# Patient Record
Sex: Male | Born: 1940 | Race: White | Hispanic: No | Marital: Married | Smoking: Former smoker
Health system: Southern US, Community
[De-identification: ages and names within clinical notes are randomized; demographics above are authoritative.]

## PROBLEM LIST (undated history)

## (undated) DIAGNOSIS — E785 Hyperlipidemia, unspecified: Secondary | ICD-10-CM

## (undated) HISTORY — PX: COLONOSCOPY: SHX174

## (undated) HISTORY — PX: HERNIA REPAIR: SHX51

## (undated) HISTORY — DX: Hyperlipidemia, unspecified: E78.5

## (undated) HISTORY — PX: POLYPECTOMY: SHX149

---

## 1998-12-09 ENCOUNTER — Ambulatory Visit (HOSPITAL_BASED_OUTPATIENT_CLINIC_OR_DEPARTMENT_OTHER): Admission: RE | Admit: 1998-12-09 | Discharge: 1998-12-09 | Payer: Self-pay | Admitting: Plastic Surgery

## 2011-04-23 ENCOUNTER — Encounter (INDEPENDENT_AMBULATORY_CARE_PROVIDER_SITE_OTHER): Payer: Self-pay | Admitting: Surgery

## 2012-11-02 DEATH — deceased

## 2014-07-17 DIAGNOSIS — R972 Elevated prostate specific antigen [PSA]: Secondary | ICD-10-CM | POA: Insufficient documentation

## 2014-07-17 DIAGNOSIS — E785 Hyperlipidemia, unspecified: Secondary | ICD-10-CM | POA: Insufficient documentation

## 2015-05-09 ENCOUNTER — Encounter: Payer: Self-pay | Admitting: Cardiology

## 2015-05-09 ENCOUNTER — Ambulatory Visit (INDEPENDENT_AMBULATORY_CARE_PROVIDER_SITE_OTHER): Payer: Medicare Other | Admitting: Cardiology

## 2015-05-09 VITALS — BP 134/82 | HR 61 | Ht 67.5 in | Wt 161.0 lb

## 2015-05-09 DIAGNOSIS — Z720 Tobacco use: Secondary | ICD-10-CM | POA: Diagnosis not present

## 2015-05-09 DIAGNOSIS — I493 Ventricular premature depolarization: Secondary | ICD-10-CM | POA: Diagnosis not present

## 2015-05-09 DIAGNOSIS — R002 Palpitations: Secondary | ICD-10-CM

## 2015-05-09 DIAGNOSIS — Z8249 Family history of ischemic heart disease and other diseases of the circulatory system: Secondary | ICD-10-CM | POA: Diagnosis not present

## 2015-05-09 DIAGNOSIS — IMO0001 Reserved for inherently not codable concepts without codable children: Secondary | ICD-10-CM

## 2015-05-09 DIAGNOSIS — Z87891 Personal history of nicotine dependence: Secondary | ICD-10-CM

## 2015-05-09 NOTE — Patient Instructions (Addendum)
Medication Instructions:  None  Labwork: None  Testing/Procedures: Triple AAA and 48 hour holter monitor in the same day  Follow-Up: Your physician wants you to follow-up in: 1 year  You will receive a reminder letter in the mail two months in advance. If you don't receive a letter, please call our office to schedule the follow-up appointment.   Any Other Special Instructions Will Be Listed Below (If Applicable). Pt will be called with appointments

## 2015-05-10 ENCOUNTER — Telehealth: Payer: Self-pay | Admitting: *Deleted

## 2015-05-10 NOTE — Telephone Encounter (Signed)
Dr. Aundra Dubin requested records from Dr. Vivianne Spence @ [phone]. Being sent over today with labs

## 2015-05-12 DIAGNOSIS — IMO0001 Reserved for inherently not codable concepts without codable children: Secondary | ICD-10-CM | POA: Insufficient documentation

## 2015-05-12 DIAGNOSIS — R002 Palpitations: Secondary | ICD-10-CM | POA: Insufficient documentation

## 2015-05-12 NOTE — Progress Notes (Signed)
Patient ID: Lee Pruitt, male   DOB: 26-May-1941, 74 y.o.   MRN: [id number] PCP: Vivianne Spence [facility])  74 yo with history of hyperlipidemia and PVCs presents for cardiology evaluation.  Patient was told in the past that he has PVCs.  He had a stress test done apparently because of this in the early 2000s that was negative per his report.  He has very good exercise tolerance.  He walks 4 miles at a time and does heavy outdoors work without exertional dyspnea or chest pain/tightness.    About 6 weeks ago, he was noticing a rapid heart rate and palpitations when lying in bed a night.  He did not have palpitations when he was up and active.  He also felt an "electrical shock" sensation down his arms as well as tremors.  He is not longer having the tachypalpitations but still notes tremors and a shock-like sensation in his hands and arms.  No upper extremity weakness or numbness.  No lightheadedness or syncope.  Recently had lab work done by PCP, sounds like all was normal (including TSH).  He has cut back on caffeine as well as on ETOH intake and thinks this has helped the palpitations.   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 Nuclear stress test in early 2000s: Normal per patient's report.   SH: Lives in [place] in summer, [place] in winter.  Married, retired Char[employer] fundraiser, 2-3 ETOH drinks/night, quit smoking > 30 years ago  FH: Father with AAA and CABG at 5  ROS: All systems negative except as per HPI.   Current Outpatient Prescriptions  Medication Sig Dispense Refill  . aspirin 81 MG chewable tablet Chew 81 mg by mouth daily.      Marland Kitchen atorvastatin (LIPITOR) 20 MG tablet Take 20 mg by mouth daily.    . DiphenhydrAMINE HCl, Sleep, (SLEEP AID, DIPHENHYDRAMINE, PO) Take by mouth. ZZZ NYQUIL BRAND    . Naproxen Sodium (ALEVE PO) Take by mouth daily. TAKES ONE TABLET     No current facility-administered  medications for this visit.   BP 134/82 mmHg  Pulse 61  Ht 5' 7.5" (1.715 m)  Wt 161 lb (73.029 kg)  BMI 24.83 kg/m2 General: NAD Neck: No JVD, no thyromegaly or thyroid nodule.  Lungs: Clear to auscultation bilaterally with normal respiratory effort. CV: Nondisplaced PMI.  Heart regular S1/S2, no S3/S4, no murmur.  No peripheral edema.  No carotid bruit.  Normal pedal pulses.  Abdomen: Soft, nontender, no hepatosplenomegaly, no distention.  Skin: Intact without lesions or rashes.  Neurologic: Alert and oriented x 3.  Psych: Normal affect. Extremities: No clubbing or cyanosis.  HEENT: Normal.   Assessment/Plan: 1. Palpitations: He was noticing these at night when lying in bed.  Not noticing during the day.   He has a history of PVCs.  I suspect that the palpitations are PVCs.  They have actually mostly resolved at this time.  No chest pain or dyspnea.  No recent increase in stress or change in medications.   - I will have him wear a 48 hour holter.  If he has frequent PVCs, he will likely need an echo. 2. AAA risk: Father had AAA, and patient is a prior smoker age 55-75 so should have AAA screening. I will arrange for abdominal US.  3. "Electrical shock" sensation/tremors: I am not sure what this is coming from.  It may not be cardiac, ?related to  c-spine arthritis.  If it continues, may need to see a neurologist.  4. Hyperlipidemia: Lipids done recently by PCP, I will ask for a copy.   Loralie Champagne 05/12/2015

## 2015-05-17 ENCOUNTER — Ambulatory Visit (HOSPITAL_COMMUNITY): Payer: Medicare Other

## 2015-05-24 ENCOUNTER — Other Ambulatory Visit: Payer: Self-pay | Admitting: Cardiology

## 2015-05-24 ENCOUNTER — Ambulatory Visit (HOSPITAL_COMMUNITY)
Admission: RE | Admit: 2015-05-24 | Discharge: 2015-05-24 | Disposition: A | Discharge status: Home / Self Care | Payer: Medicare Other | Source: Ambulatory Visit | Attending: Cardiology | Admitting: Cardiology

## 2015-05-24 ENCOUNTER — Ambulatory Visit (INDEPENDENT_AMBULATORY_CARE_PROVIDER_SITE_OTHER): Payer: Medicare Other

## 2015-05-24 ENCOUNTER — Other Ambulatory Visit: Payer: Self-pay

## 2015-05-24 ENCOUNTER — Ambulatory Visit (HOSPITAL_COMMUNITY): Admission: RE | Admit: 2015-05-24 | Payer: Medicare Other | Source: Ambulatory Visit

## 2015-05-24 ENCOUNTER — Telehealth: Payer: Self-pay | Admitting: *Deleted

## 2015-05-24 ENCOUNTER — Other Ambulatory Visit: Payer: Self-pay | Admitting: *Deleted

## 2015-05-24 ENCOUNTER — Other Ambulatory Visit (HOSPITAL_COMMUNITY): Payer: Medicare Other

## 2015-05-24 DIAGNOSIS — R002 Palpitations: Secondary | ICD-10-CM

## 2015-05-24 DIAGNOSIS — I493 Ventricular premature depolarization: Secondary | ICD-10-CM

## 2015-05-24 DIAGNOSIS — Z87891 Personal history of nicotine dependence: Secondary | ICD-10-CM

## 2015-05-24 DIAGNOSIS — Z720 Tobacco use: Secondary | ICD-10-CM

## 2015-05-24 DIAGNOSIS — Z8249 Family history of ischemic heart disease and other diseases of the circulatory system: Secondary | ICD-10-CM

## 2015-05-24 DIAGNOSIS — I714 Abdominal aortic aneurysm, without rupture: Secondary | ICD-10-CM | POA: Insufficient documentation

## 2015-05-24 DIAGNOSIS — Z8489 Family history of other specified conditions: Secondary | ICD-10-CM | POA: Diagnosis not present

## 2015-05-24 DIAGNOSIS — IMO0001 Reserved for inherently not codable concepts without codable children: Secondary | ICD-10-CM

## 2015-05-24 DIAGNOSIS — Z136 Encounter for screening for cardiovascular disorders: Secondary | ICD-10-CM

## 2015-05-24 NOTE — Telephone Encounter (Signed)
The hospital called stated put test in wrong try to correct was very confusion.  Change test with the Assistance of Bernardo Heater, RN worked on this test for 1 hour finally put test in correctly. Bernardo Heater, RN stated will keep eye on test to fall out of the loop.  Pt did have AAA duplex for medicare screening at hospital. Will route to [facility]

## 2015-05-24 NOTE — Telephone Encounter (Signed)
See note from Orpah Greek. Will continue to follow.

## 2015-05-27 ENCOUNTER — Telehealth: Payer: Self-pay | Admitting: Cardiology

## 2015-05-27 NOTE — Telephone Encounter (Signed)
Pt called to say he dropped off monitor today, pt given results of  abdominal ultrasound done 05/24/15.

## 2015-05-27 NOTE — Telephone Encounter (Signed)
New message     Pt returning call regarding monitor wore this weekend. Please call to discuss.

## 2015-11-06 DIAGNOSIS — E538 Deficiency of other specified B group vitamins: Secondary | ICD-10-CM | POA: Insufficient documentation

## 2017-06-06 IMAGING — US US AORTA
1 series · 14 of 23 positions shown · non-contrast
Comparison: No priors.

CLINICAL DATA: 73-year-old female with family history of abdominal
aortic aneurysm. History of smoking.

EXAM:
ULTRASOUND OF ABDOMINAL AORTA
TECHNIQUE: Ultrasound examination of the abdominal aorta was performed to
evaluate for abdominal aortic aneurysm.

[Series 1: us aorta · 0.22mm/px · 14 of 23 slices shown]
[im 1/23]
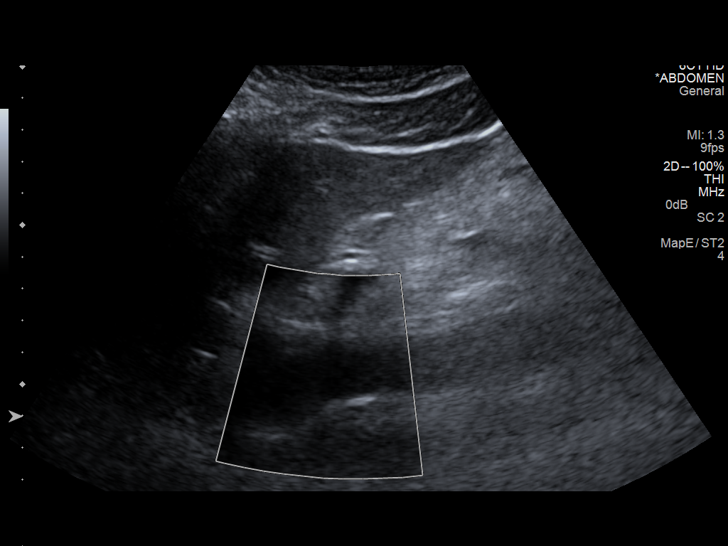
[im 3/23]
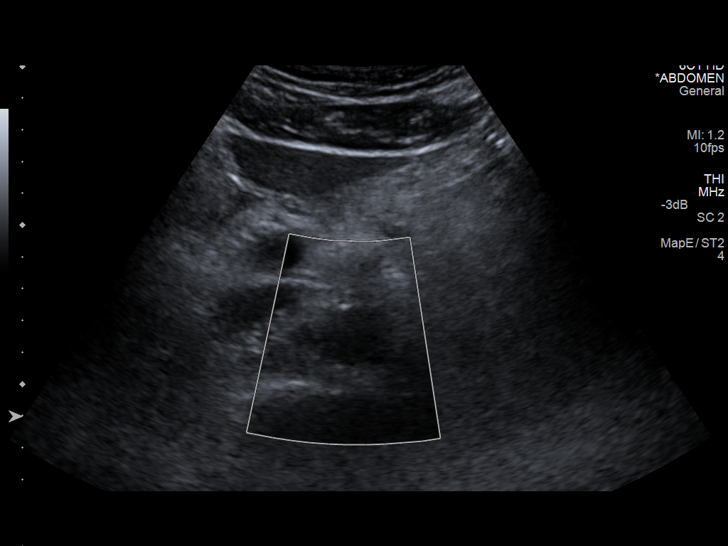
[im 5/23]
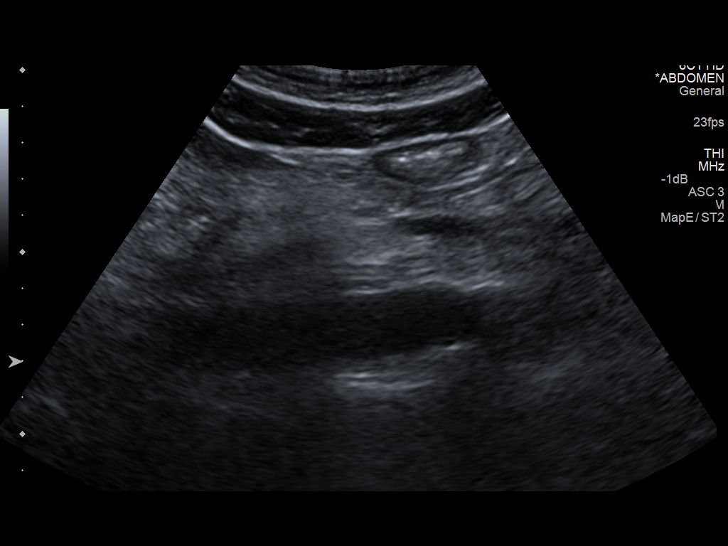
[im 6/23]
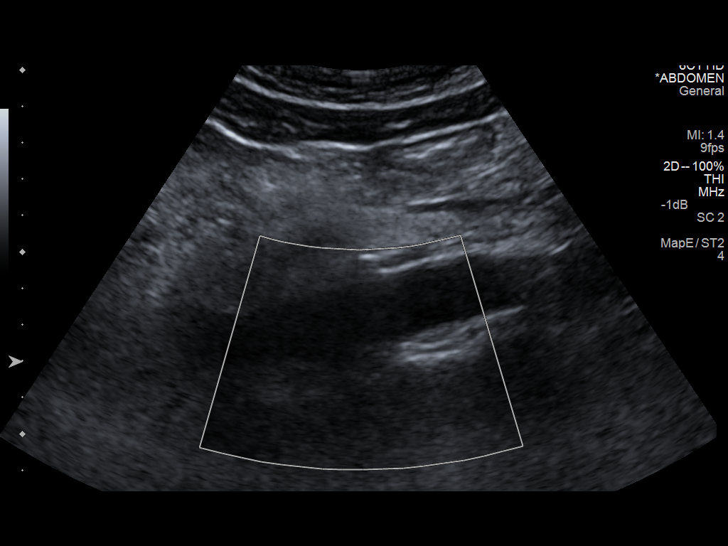
[im 8/23]
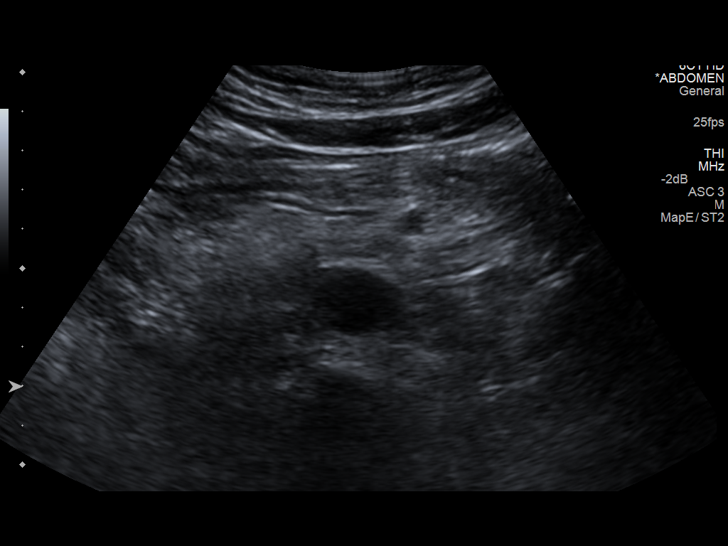
[im 10/23]
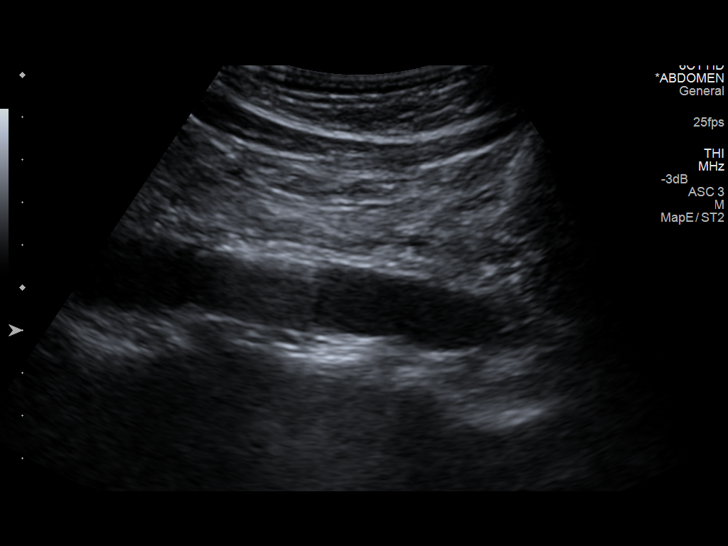
[im 11/23]
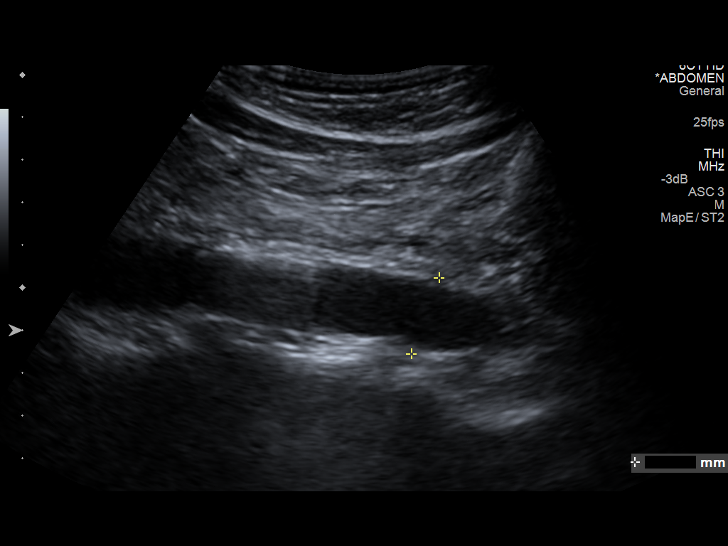
[im 13/23]
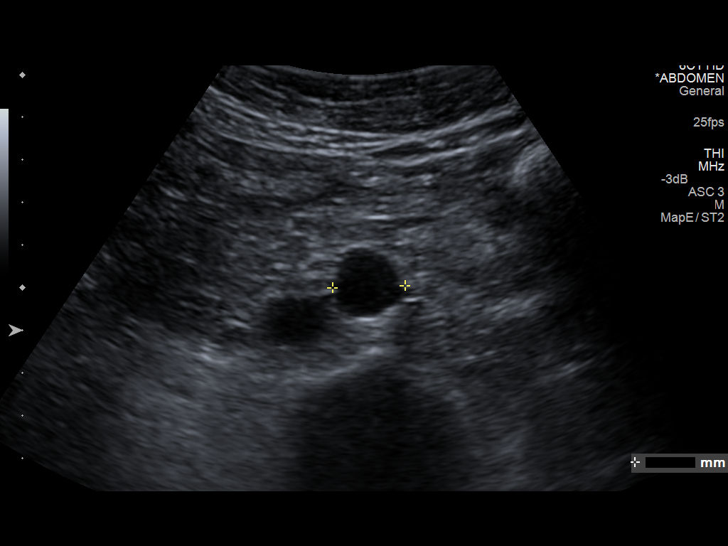
[im 14/23]
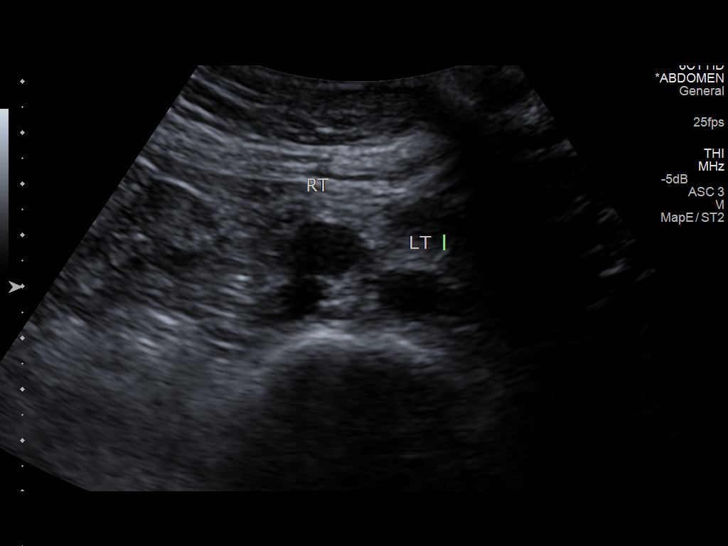
[im 16/23]
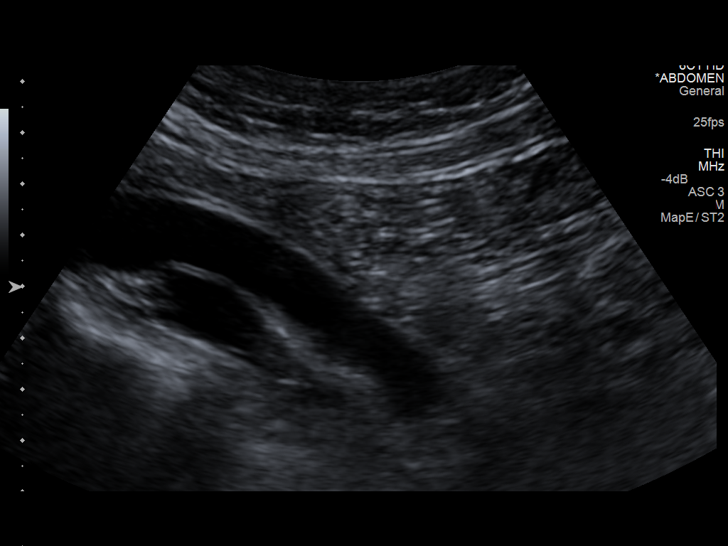
[im 18/23]
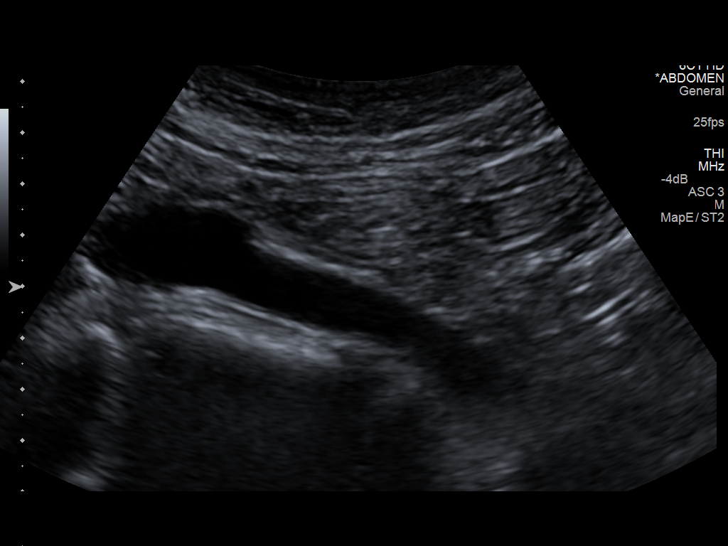
[im 19/23]
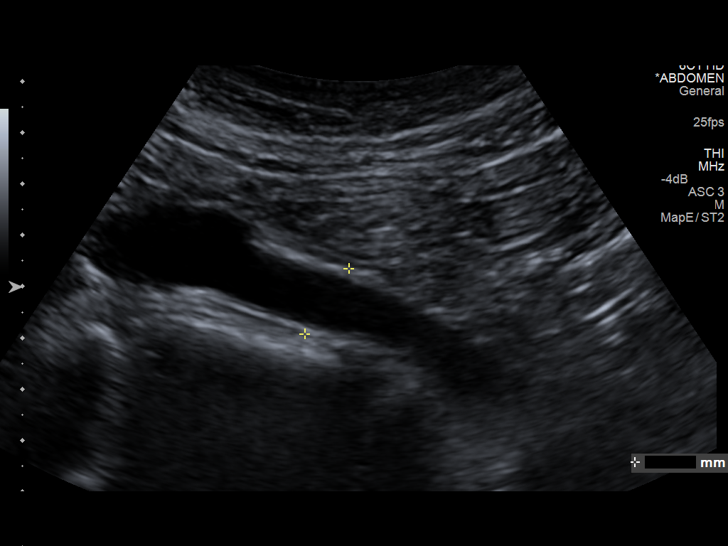
[im 21/23]
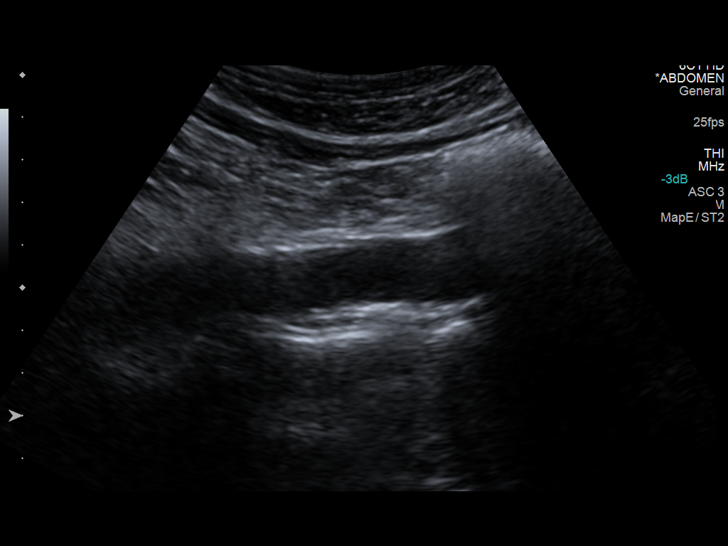
[im 23/23]
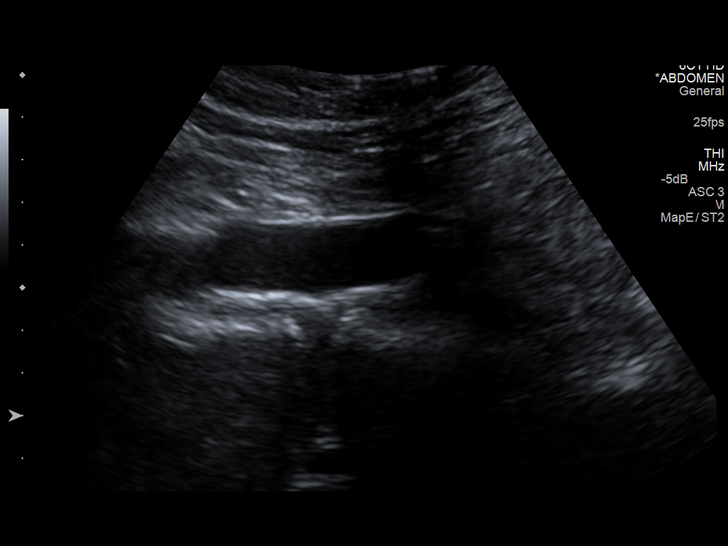

[14 of 23 positions shown; findings below may reference images not displayed]

FINDINGS: Abdominal Aorta

No aneurysm identified.

Maximum AP

Diameter:  2.3 cm proximally

Maximum TRV

Diameter: 2.3 cm proximally

Common Iliac Arteries

Right common iliac artery measures up to 1.4 x 1.5 cm. Left common
iliac artery measures up to 1.5 x 1.7 cm.
IMPRESSION: 1. No abdominal aortic aneurysm.
2. Ectasia of the common iliac arteries bilaterally (left greater
than right).

## 2017-10-13 ENCOUNTER — Telehealth: Payer: Self-pay | Admitting: Internal Medicine

## 2017-10-13 NOTE — Telephone Encounter (Signed)
Received colon/path reports. Patient says that he is due for another colonoscopy. He is requesting to see Dr. Henrene Pastor. Records placed on his desk for review.

## 2017-10-21 NOTE — Telephone Encounter (Signed)
Dr Henrene Pastor received, reviewed and accepted for  Lee Pruitt to transfer care to [facility].  I have called and left him a voicemail message to call back and schedule a Direct Colonoscopy with a Previsit appointment.

## 2017-10-27 NOTE — Telephone Encounter (Signed)
Spoke with patient who states he wants to schedule colon in April 2019. Patient will cb in February to set up appt. Records will be in records review file folder till then.

## 2017-12-03 ENCOUNTER — Encounter: Payer: Self-pay | Admitting: Internal Medicine

## 2018-02-07 ENCOUNTER — Other Ambulatory Visit: Payer: Self-pay

## 2018-02-07 ENCOUNTER — Ambulatory Visit (AMBULATORY_SURGERY_CENTER): Payer: Self-pay

## 2018-02-07 VITALS — Ht 67.0 in | Wt 167.0 lb

## 2018-02-07 DIAGNOSIS — Z1211 Encounter for screening for malignant neoplasm of colon: Secondary | ICD-10-CM

## 2018-02-07 NOTE — Progress Notes (Signed)
No egg or soy allergy known to patient  No issues with past sedation with any surgeries  or procedures, no intubation problems  No diet pills per patient No home 02 use per patient  No blood thinners per patient  Pt denies issues with constipation  No A fib or A flutter  EMMI video sent to pt's e mail , pt declined    

## 2018-02-22 ENCOUNTER — Encounter: Payer: Self-pay | Admitting: Internal Medicine

## 2018-02-22 ENCOUNTER — Ambulatory Visit (AMBULATORY_SURGERY_CENTER): Payer: Medicare Other | Admitting: Internal Medicine

## 2018-02-22 VITALS — BP 113/74 | HR 66 | Temp 96.2°F | Resp 15 | Ht 67.0 in | Wt 167.0 lb

## 2018-02-22 DIAGNOSIS — Z1211 Encounter for screening for malignant neoplasm of colon: Secondary | ICD-10-CM

## 2018-02-22 DIAGNOSIS — Z1212 Encounter for screening for malignant neoplasm of rectum: Secondary | ICD-10-CM

## 2018-02-22 MED ORDER — SODIUM CHLORIDE 0.9 % IV SOLN: 500.0000 mL | Freq: Once | INTRAVENOUS | Status: DC

## 2018-02-22 NOTE — Op Note (Signed)
[place] Patient Name: Lee Pruitt Procedure Date: 02/22/2018 11:11 AM MRN: [id number] Endoscopist: Docia Chuck. Henrene Pastor , MD Age: 77 Referring MD:  Date of Birth: December 09, 1940 Gender: Male Account #: [id number] Procedure:                Colonoscopy Indications:              Screening for colorectal malignant neoplasm. Has                            had several examinations previously which were                            negative. Last one elsewhere Medicines:                Monitored Anesthesia Care Procedure:                Pre-Anesthesia Assessment:                           - Prior to the procedure, a History and Physical                            was performed, and patient medications and                            allergies were reviewed. The patient's tolerance of                            previous anesthesia was also reviewed. The risks                            and benefits of the procedure and the sedation                            options and risks were discussed with the patient.                            All questions were answered, and informed consent                            was obtained. Prior Anticoagulants: The patient has                            taken no previous anticoagulant or antiplatelet                            agents. ASA Grade Assessment: II - A patient with                            mild systemic disease. After reviewing the risks                            and benefits, the patient was deemed in  satisfactory condition to undergo the procedure.                           After obtaining informed consent, the colonoscope                            was passed under direct vision. Throughout the                            procedure, the patient's blood pressure, pulse, and                            oxygen saturations were monitored continuously. The                            Colonoscope was introduced through  the anus and                            advanced to the the cecum, identified by                            appendiceal orifice and ileocecal valve. The                            ileocecal valve, appendiceal orifice, and rectum                            were photographed. The quality of the bowel                            preparation was excellent. The colonoscopy was                            performed without difficulty. The patient tolerated                            the procedure well. The bowel preparation used was                            SUPREP. Scope In: 11:23:01 AM Scope Out: 11:40:22 AM Scope Withdrawal Time: 0 hours 14 minutes 5 seconds  Total Procedure Duration: 0 hours 17 minutes 21 seconds  Findings:                 Multiple diverticula were found in the sigmoid                            colon.                           The exam was otherwise without abnormality on                            direct and retroflexion views. Complications:            No immediate complications. Estimated blood loss:  None. Estimated Blood Loss:     Estimated blood loss: none. Impression:               - Diverticulosis in the sigmoid colon.                           - The examination was otherwise normal on direct                            and retroflexion views.                           - No specimens collected. Recommendation:           - Repeat colonoscopy is not recommended for                            surveillance.                           - Patient has a contact number available for                            emergencies. The signs and symptoms of potential                            delayed complications were discussed with the                            patient. Return to normal activities tomorrow.                            Written discharge instructions were provided to the                            patient.                           -  Resume previous diet.                           - Continue present medications. Docia Chuck. Henrene Pastor, MD 02/22/2018 11:46:00 AM This report has been signed electronically.

## 2018-02-22 NOTE — Progress Notes (Signed)
Pt's states no medical or surgical changes since previsit or office visit. 

## 2018-02-22 NOTE — Progress Notes (Signed)
Spontaneous respirations throughout. VSS. Resting comfortably. To PACU on room air. Report to  RN. 

## 2018-02-22 NOTE — Patient Instructions (Signed)
**   Handout given on diverticulosis **   YOU HAD AN ENDOSCOPIC PROCEDURE TODAY AT THE [facility]:   Refer to the procedure report that was given to you for any specific questions about what was found during the examination.  If the procedure report does not answer your questions, please call your gastroenterologist to clarify.  If you requested that your care partner not be given the details of your procedure findings, then the procedure report has been included in a sealed envelope for you to review at your convenience later.  YOU SHOULD EXPECT: Some feelings of bloating in the abdomen. Passage of more gas than usual.  Walking can help get rid of the air that was put into your GI tract during the procedure and reduce the bloating. If you had a lower endoscopy (such as a colonoscopy or flexible sigmoidoscopy) you may notice spotting of blood in your stool or on the toilet paper. If you underwent a bowel prep for your procedure, you may not have a normal bowel movement for a few days.  Please Note:  You might notice some irritation and congestion in your nose or some drainage.  This is from the oxygen used during your procedure.  There is no need for concern and it should clear up in a day or so.  SYMPTOMS TO REPORT IMMEDIATELY:   Following lower endoscopy (colonoscopy or flexible sigmoidoscopy):  Excessive amounts of blood in the stool  Significant tenderness or worsening of abdominal pains  Swelling of the abdomen that is new, acute  Fever of 100F or higher  For urgent or emergent issues, a gastroenterologist can be reached at any hour by calling [phone].   DIET:  We do recommend a small meal at first, but then you may proceed to your regular diet.  Drink plenty of fluids but you should avoid alcoholic beverages for 24 hours.  ACTIVITY:  You should plan to take it easy for the rest of today and you should NOT DRIVE or use heavy machinery until tomorrow (because of the  sedation medicines used during the test).    FOLLOW UP: Our staff will call the number listed on your records the next business day following your procedure to check on you and address any questions or concerns that you may have regarding the information given to you following your procedure. If we do not reach you, we will leave a message.  However, if you are feeling well and you are not experiencing any problems, there is no need to return our call.  We will assume that you have returned to your regular daily activities without incident.  If any biopsies were taken you will be contacted by phone or by letter within the next 1-3 weeks.  Please call us at [phone] if you have not heard about the biopsies in 3 weeks.    SIGNATURES/CONFIDENTIALITY: You and/or your care partner have signed paperwork which will be entered into your electronic medical record.  These signatures attest to the fact that that the information above on your After Visit Summary has been reviewed and is understood.  Full responsibility of the confidentiality of this discharge information lies with you and/or your care-partner. 

## 2018-02-23 ENCOUNTER — Telehealth: Payer: Self-pay

## 2018-02-23 NOTE — Telephone Encounter (Signed)
  Follow up Call-  Call back number 02/22/2018  Post procedure Call Back phone  # [phone]-wife cell of to speak to her.  Permission to leave phone message Yes  Some recent data might be hidden     Patient questions:  Do you have a fever, pain , or abdominal swelling? No. Pain Score  0 *  Have you tolerated food without any problems? Yes.    Have you been able to return to your normal activities? Yes.    Do you have any questions about your discharge instructions: Diet   No. Medications  No. Follow up visit  No.  Do you have questions or concerns about your Care? No.  Actions: * If pain score is 4 or above: No action needed, pain <4.

## 2020-10-10 ENCOUNTER — Other Ambulatory Visit: Payer: Self-pay | Admitting: Urology

## 2020-10-10 DIAGNOSIS — R972 Elevated prostate specific antigen [PSA]: Secondary | ICD-10-CM

## 2021-04-23 ENCOUNTER — Encounter: Payer: Self-pay | Admitting: Internal Medicine

## 2021-04-23 ENCOUNTER — Ambulatory Visit (INDEPENDENT_AMBULATORY_CARE_PROVIDER_SITE_OTHER): Payer: Medicare Other | Admitting: Internal Medicine

## 2021-04-23 ENCOUNTER — Other Ambulatory Visit: Payer: Self-pay

## 2021-04-23 DIAGNOSIS — N529 Male erectile dysfunction, unspecified: Secondary | ICD-10-CM

## 2021-04-23 DIAGNOSIS — E785 Hyperlipidemia, unspecified: Secondary | ICD-10-CM

## 2021-04-23 DIAGNOSIS — C61 Malignant neoplasm of prostate: Secondary | ICD-10-CM

## 2021-04-23 DIAGNOSIS — M2550 Pain in unspecified joint: Secondary | ICD-10-CM

## 2021-04-23 DIAGNOSIS — F419 Anxiety disorder, unspecified: Secondary | ICD-10-CM | POA: Insufficient documentation

## 2021-04-23 LAB — URINALYSIS
Bilirubin Urine: NEGATIVE
Hgb urine dipstick: NEGATIVE
Ketones, ur: NEGATIVE
Leukocytes,Ua: NEGATIVE
Nitrite: NEGATIVE
Specific Gravity, Urine: 1.01 (ref 1.000–1.030)
Total Protein, Urine: NEGATIVE
Urine Glucose: NEGATIVE
Urobilinogen, UA: 0.2 (ref 0.0–1.0)
pH: 7 (ref 5.0–8.0)

## 2021-04-23 LAB — LIPID PANEL
Cholesterol: 168 mg/dL (ref 0–200)
HDL: 50.4 mg/dL (ref >39.00)
LDL Cholesterol: 104 mg/dL — ABNORMAL HIGH (ref 0–99)
NonHDL: 117.78
Total CHOL/HDL Ratio: 3
Triglycerides: 71 mg/dL (ref 0.0–149.0)
VLDL: 14.2 mg/dL (ref 0.0–40.0)

## 2021-04-23 LAB — CBC WITH DIFFERENTIAL/PLATELET
Basophils Absolute: 0 10*3/uL (ref 0.0–0.1)
Basophils Relative: 1.1 % (ref 0.0–3.0)
Eosinophils Absolute: 0.1 10*3/uL (ref 0.0–0.7)
Eosinophils Relative: 2.3 % (ref 0.0–5.0)
HCT: 45.9 % (ref 39.0–52.0)
Hemoglobin: 16.2 g/dL (ref 13.0–17.0)
Lymphocytes Relative: 21.7 % (ref 12.0–46.0)
Lymphs Abs: 1 10*3/uL (ref 0.7–4.0)
MCHC: 35.4 g/dL (ref 30.0–36.0)
MCV: 92.5 fl (ref 78.0–100.0)
Monocytes Absolute: 0.4 10*3/uL (ref 0.1–1.0)
Monocytes Relative: 9.6 % (ref 3.0–12.0)
Neutro Abs: 2.9 10*3/uL (ref 1.4–7.7)
Neutrophils Relative %: 65.3 % (ref 43.0–77.0)
Platelets: 142 10*3/uL — ABNORMAL LOW (ref 150.0–400.0)
RBC: 4.96 Mil/uL (ref 4.22–5.81)
RDW: 13 % (ref 11.5–15.5)
WBC: 4.5 10*3/uL (ref 4.0–10.5)

## 2021-04-23 LAB — COMPREHENSIVE METABOLIC PANEL
ALT: 25 U/L (ref 0–53)
AST: 19 U/L (ref 0–37)
Albumin: 4.7 g/dL (ref 3.5–5.2)
Alkaline Phosphatase: 72 U/L (ref 39–117)
BUN: 16 mg/dL (ref 6–23)
CO2: 27 mEq/L (ref 19–32)
Calcium: 9.2 mg/dL (ref 8.4–10.5)
Chloride: 104 mEq/L (ref 96–112)
Creatinine, Ser: 0.87 mg/dL (ref 0.40–1.50)
GFR: 82.06 mL/min (ref >60.00)
Glucose, Bld: 89 mg/dL (ref 70–99)
Potassium: 4.2 mEq/L (ref 3.5–5.1)
Sodium: 139 mEq/L (ref 135–145)
Total Bilirubin: 1.3 mg/dL — ABNORMAL HIGH (ref 0.2–1.2)
Total Protein: 6.7 g/dL (ref 6.0–8.3)

## 2021-04-23 LAB — CK: Total CK: 56 U/L (ref 7–232)

## 2021-04-23 LAB — URIC ACID: Uric Acid, Serum: 6.3 mg/dL (ref 4.0–7.8)

## 2021-04-23 LAB — TSH: TSH: 1.44 u[IU]/mL (ref 0.35–4.50)

## 2021-04-23 MED ORDER — VITAMIN D3 50 MCG (2000 UT) PO CAPS: 2000.0000 [IU] | ORAL_CAPSULE | Freq: Every day | ORAL | 3 refills | Status: DC

## 2021-04-23 MED ORDER — ESCITALOPRAM OXALATE 5 MG PO TABS: 5.0000 mg | ORAL_TABLET | Freq: Every day | ORAL | 5 refills | Status: DC

## 2021-04-23 NOTE — Progress Notes (Signed)
Subjective:  Patient ID: Lee Pruitt, male    DOB: [birth year]  Age: 80 y.o. MRN: [id number]  CC: Follow-up and New Patient (Initial Visit)   HPI Lee Pruitt presents for a new pt visit  F/u on elevated PSA - bx in 2022 - prostate cancer C/o arthralgias - taking Aleve every day. 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     Naproxen Sodium (ALEVE PO) Take by mouth daily. TAKES ONE TABLET     tadalafil (CIALIS) 10 MG tablet Take by mouth. Take 1/2 -1 tablets by mouth as needed     aspirin 81 MG chewable tablet Chew 81 mg by mouth daily.   (Patient not taking: Reported on 04/23/2021)     DiphenhydrAMINE HCl, Sleep, (SLEEP AID, DIPHENHYDRAMINE, PO) Take by mouth. ZZZ NYQUIL BRAND (Patient not taking: Reported on 04/23/2021)     0.9 %  sodium chloride infusion      No facility-administered medications prior to visit.    ROS: Review of Systems  Constitutional:  Negative for appetite change, fatigue and unexpected weight change.  HENT:  Negative for congestion, nosebleeds, sneezing, sore throat and trouble swallowing.   Eyes:  Negative for itching and visual disturbance.  Respiratory:  Negative for cough.   Cardiovascular:  Negative for chest pain, palpitations and leg swelling.  Gastrointestinal:  Negative for abdominal distention, blood in stool, diarrhea and nausea.  Genitourinary:  Negative for frequency and hematuria.  Musculoskeletal:  Positive for arthralgias. Negative for back pain, gait problem, joint swelling and neck pain.  Skin:  Negative for rash.  Neurological:  Negative for dizziness, tremors, speech difficulty and weakness.  Psychiatric/Behavioral:  Negative for agitation, dysphoric mood and sleep disturbance. The patient is not nervous/anxious.    Objective:  BP 128/80 (BP Location: Left Arm)    Pulse (!) 49   Temp 98.1 F (36.7 C) (Oral)   Ht 5\' 7"  (1.702 m)   Wt 164 lb 3.2 oz (74.5 kg)   SpO2 96%   BMI 25.72 kg/m   BP Readings from Last 3 Encounters:  04/23/21 128/80  02/22/18 113/74  05/09/15 134/82    Wt Readings from Last 3 Encounters:  04/23/21 164 lb 3.2 oz (74.5 kg)  02/22/18 167 lb (75.8 kg)  02/07/18 167 lb (75.8 kg)    Physical Exam Constitutional:      General: He is not in acute distress.    Appearance: He is well-developed.     Comments: NAD  Eyes:     Conjunctiva/sclera: Conjunctivae normal.     Pupils: Pupils are equal, round, and reactive to light.  Neck:     Thyroid: No thyromegaly.     Vascular: No JVD.  Cardiovascular:     Rate and Rhythm: Normal rate and regular rhythm.     Heart sounds: Normal heart sounds. No murmur heard.   No friction rub. No gallop.  Pulmonary:     Effort: Pulmonary effort is normal. No respiratory distress.     Breath sounds: Normal breath sounds. No wheezing or rales.  Chest:     Chest wall: No tenderness.  Abdominal:     General: Bowel sounds are normal. There is no distension.     Palpations: Abdomen is soft. There is no mass.     Tenderness: There  is no abdominal tenderness. There is no guarding or rebound.  Musculoskeletal:        General: Tenderness present. Normal range of motion.     Cervical back: Normal range of motion.  Lymphadenopathy:     Cervical: No cervical adenopathy.  Skin:    General: Skin is warm and dry.     Findings: No rash.  Neurological:     Mental Status: He is alert.     Cranial Nerves: No cranial nerve deficit.     Motor: No abnormal muscle tone.     Coordination: Coordination normal.     Gait: Gait normal.     Deep Tendon Reflexes: Reflexes are normal and symmetric.  Psychiatric:        Behavior: Behavior normal.        Thought Content: Thought content normal.        Judgment: Judgment normal.    No results found for: WBC, HGB, HCT, PLT, GLUCOSE, CHOL, TRIG, HDL,  LDLDIRECT, LDLCALC, ALT, AST, NA, K, CL, CREATININE, BUN, CO2, TSH, PSA, INR, GLUF, HGBA1C, MICROALBUR  US Aorta  Result Date: 05/24/2015 CLINICAL DATA:  80 year old male with family history of abdominal aortic aneurysm. History of smoking. EXAM: ULTRASOUND OF ABDOMINAL AORTA TECHNIQUE: Ultrasound examination of the abdominal aorta was performed to evaluate for abdominal aortic aneurysm. COMPARISON:  No priors. FINDINGS: Abdominal Aorta No aneurysm identified. Maximum AP Diameter:  2.3 cm proximally Maximum TRV Diameter: 2.3 cm proximally Common Iliac Arteries Right common iliac artery measures up to 1.4 x 1.5 cm. Left common iliac artery measures up to 1.5 x 1.7 cm. IMPRESSION: 1. No abdominal aortic aneurysm. 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 Electronically Signed   By: Vinnie Langton M.D.   On: 05/24/2015 11:58   Holter monitor - 48 hour  Result Date: 06/07/2015 Occasional PVCs Occasional PACs No atrial fibrillation No worrisome arrhythmias.    Assessment & Plan:     Walker Kehr, MD

## 2021-04-23 NOTE — Assessment & Plan Note (Addendum)
Chronic  Lexapro - low dose

## 2021-04-23 NOTE — Patient Instructions (Signed)

## 2021-04-28 DIAGNOSIS — N529 Male erectile dysfunction, unspecified: Secondary | ICD-10-CM | POA: Insufficient documentation

## 2021-04-28 NOTE — Assessment & Plan Note (Signed)
Status post prostate biopsy this year.  Watchful waiting.  Follow-up with urology.  He will let me know if problems.

## 2021-04-28 NOTE — Assessment & Plan Note (Addendum)
Obtain uric acid to rule out gout.  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 Take vitamin D.  He is taking Aleve with caution.

## 2021-04-28 NOTE — Assessment & Plan Note (Addendum)
Not on statins.  Will obtain lipids.  Coronary calcium CT ordered Obtain lipids and other labs

## 2021-04-28 NOTE — Assessment & Plan Note (Signed)
Continue with Cialis 10 mg a day as needed

## 2021-04-28 NOTE — Assessment & Plan Note (Signed)
Coronary calcium CT ordered 

## 2021-05-22 ENCOUNTER — Other Ambulatory Visit: Payer: Self-pay

## 2021-05-22 ENCOUNTER — Ambulatory Visit (INDEPENDENT_AMBULATORY_CARE_PROVIDER_SITE_OTHER)
Admission: RE | Admit: 2021-05-22 | Discharge: 2021-05-22 | Disposition: A | Discharge status: Home / Self Care | Payer: Self-pay | Source: Ambulatory Visit | Attending: Internal Medicine | Admitting: Internal Medicine

## 2021-05-22 DIAGNOSIS — E785 Hyperlipidemia, unspecified: Secondary | ICD-10-CM

## 2021-05-24 ENCOUNTER — Other Ambulatory Visit: Payer: Self-pay | Admitting: Internal Medicine

## 2021-05-24 DIAGNOSIS — I2510 Atherosclerotic heart disease of native coronary artery without angina pectoris: Secondary | ICD-10-CM

## 2021-05-24 DIAGNOSIS — E785 Hyperlipidemia, unspecified: Secondary | ICD-10-CM

## 2021-05-24 DIAGNOSIS — I700 Atherosclerosis of aorta: Secondary | ICD-10-CM

## 2021-05-24 MED ORDER — ASPIRIN EC 81 MG PO TBEC: 81.0000 mg | DELAYED_RELEASE_TABLET | Freq: Every day | ORAL | 3 refills | Status: AC

## 2021-05-24 NOTE — Assessment & Plan Note (Signed)
Resume Lipitor.  Take aspirin.  Cardiology consultation

## 2021-05-28 ENCOUNTER — Telehealth: Payer: Self-pay | Admitting: *Deleted

## 2021-05-28 NOTE — Telephone Encounter (Signed)
Pt was notified concerning cardiac scoring test. Pt needs clarification of Lipitor '10mg'$  daily or '20mg'$  daily.  Please advise &send prescription to [facility] on [address].Marland KitchenJohny Chess

## 2021-05-29 MED ORDER — ATORVASTATIN CALCIUM 10 MG PO TABS: 10.0000 mg | ORAL_TABLET | Freq: Every day | ORAL | 3 refills | Status: DC

## 2021-05-29 NOTE — Telephone Encounter (Signed)
Called pt there was no answer LMOM w/MD response. Sent new rx for lipitor.Marland KitchenJohny Pruitt

## 2021-05-29 NOTE — Telephone Encounter (Signed)
Lipitor 10 mg a day.  Follow-up with me in 3 months with lipids, c-Met, CK Thanks

## 2021-06-12 ENCOUNTER — Telehealth: Payer: Self-pay | Admitting: Internal Medicine

## 2021-06-12 MED ORDER — VITAMIN D3 50 MCG (2000 UT) PO CAPS: 2000.0000 [IU] | ORAL_CAPSULE | Freq: Every day | ORAL | 3 refills | Status: AC

## 2021-06-12 NOTE — Telephone Encounter (Signed)
[facility] to [facility]

## 2021-06-12 NOTE — Telephone Encounter (Signed)
   Patient requesting pharmacy change  Please send order for Cholecalciferol (VITAMIN D3) 50 MCG (2000 UT) capsule   Pharmacy [id number] [address]

## 2021-06-23 ENCOUNTER — Encounter: Payer: Self-pay | Admitting: Internal Medicine

## 2021-06-23 ENCOUNTER — Ambulatory Visit (INDEPENDENT_AMBULATORY_CARE_PROVIDER_SITE_OTHER): Payer: Medicare Other | Admitting: Internal Medicine

## 2021-06-23 ENCOUNTER — Other Ambulatory Visit: Payer: Self-pay

## 2021-06-23 VITALS — BP 126/78 | HR 50 | Ht 67.0 in | Wt 164.8 lb

## 2021-06-23 DIAGNOSIS — E785 Hyperlipidemia, unspecified: Secondary | ICD-10-CM

## 2021-06-23 DIAGNOSIS — I2510 Atherosclerotic heart disease of native coronary artery without angina pectoris: Secondary | ICD-10-CM | POA: Diagnosis not present

## 2021-06-23 DIAGNOSIS — I2583 Coronary atherosclerosis due to lipid rich plaque: Secondary | ICD-10-CM

## 2021-06-23 DIAGNOSIS — R918 Other nonspecific abnormal finding of lung field: Secondary | ICD-10-CM

## 2021-06-23 MED ORDER — ATORVASTATIN CALCIUM 20 MG PO TABS: 20.0000 mg | ORAL_TABLET | Freq: Every day | ORAL | 3 refills | Status: DC

## 2021-06-23 NOTE — Patient Instructions (Addendum)
Medication Instructions:  Your physician has recommended you make the following change in your medication:  1-Increase Lipitor 20 mg by mouth daily.  *If you need a refill on your cardiac medications before your next appointment, please call your pharmacy*  Lab Work: Your physician recommends that you return for lab work in: 12 weeks for LPA, NMR lipoprofile, and apolipoprotein  If you have labs (blood work) drawn today and your tests are completely normal, you will receive your results only by: [place] (if you have MyChart) OR A paper copy in the mail If you have any lab test that is abnormal or we need to change your treatment, we will call you to review the results.  Testing/Procedures: Non-Cardiac CT scanning in one year, (CAT scanning), is a noninvasive, special x-ray that produces cross-sectional images of the body using x-rays and a computer. CT scans help physicians diagnose and treat medical conditions. For some CT exams, a contrast material is used to enhance visibility in the area of the body being studied. CT scans provide greater clarity and reveal more details than regular x-ray exams.  Follow-Up: At [facility], you and your health needs are our priority.  As part of our continuing mission to provide you with exceptional heart care, we have created designated Provider Care Teams.  These Care Teams include your primary Cardiologist (physician) and Advanced Practice Providers (APPs -  Physician Assistants and Nurse Practitioners) who all work together to provide you with the care you need, when you need it.  We recommend signing up for the patient portal called "MyChart".  Sign up information is provided on this After Visit Summary.  MyChart is used to connect with patients for Virtual Visits (Telemedicine).  Patients are able to view lab/test results, encounter notes, upcoming appointments, etc.  Non-urgent messages can be sent to your provider as well.   To learn more  about what you can do with MyChart, go to [email or web address].    Your next appointment:   12 month(s)  The format for your next appointment:   In Person  Provider:   You may see Dr. Harrington Challenger or one of the following Advanced Practice Providers on your designated Care Team:   Richardson Dopp, PA-C [place], [place]

## 2021-06-23 NOTE — Progress Notes (Signed)
Cardiology Office Note   Date:  06/23/2021   ID:  Rankin, November [birth year], MRN [id number]  PCP:  Cassandria Anger, MD  Cardiologist:   Dorris Carnes, MD   Patient self referred for evaluation of coronary calcium     History of Present Illness: Lee Pruitt is a 80 y.o. male with a history of prostate CA, hyperlipidemia  Follows with A Plotnikov  Pt had a calcium score CT in July   Score 442 (54% percentile for age, race, sex0  Seen in LM, lAD, LCx and RCA   Atherosclerosis seen on aorta  and small pulmonary nodule   The pt says he is very active   walks briskly  Hikes in [place]   He denies CP   Breathing is OK   No dizziness   Current Meds  Medication Sig   aspirin EC 81 MG tablet Take 1 tablet (81 mg total) by mouth daily.   atorvastatin (LIPITOR) 10 MG tablet Take 1 tablet (10 mg total) by mouth at bedtime. Take 1 by mouth at bedtime   Cholecalciferol (VITAMIN D3) 50 MCG (2000 UT) capsule Take 1 capsule (2,000 Units total) by mouth daily.   escitalopram (LEXAPRO) 5 MG tablet Take 1 tablet (5 mg total) by mouth daily.   Naproxen Sodium (ALEVE PO) Take by mouth daily. TAKES ONE TABLET   tadalafil (CIALIS) 10 MG tablet Take by mouth. Take 1/2 -1 tablets by mouth as needed     Allergies:   Patient has no known allergies.   Past Medical History:  Diagnosis Date   Hyperlipidemia    Lipoma    upper right arm    Past Surgical History:  Procedure Laterality Date   COLONOSCOPY     HERNIA REPAIR     POLYPECTOMY       Social History:  The patient  reports that he has quit smoking. He has never used smokeless tobacco. He reports current alcohol use. He reports that he does not use drugs.   Family History:  The patient's family history includes Heart disease in his father; Ovarian cancer in his sister.    ROS:  Please see the history of present illness. All other systems are reviewed and  Negative to the above problem except as noted.    PHYSICAL  EXAM: VS:  BP 126/78   Pulse (!) 50   Ht '5\' 7"'$  (1.702 m)   Wt 164 lb 12.8 oz (74.8 kg)   SpO2 96%   BMI 25.81 kg/m   GEN: Well nourished, well developed, in no acute distress  HEENT: normal  Neck: no JVD, carotid bruits Cardiac: RRR; no murmurs No LE  edema  Respiratory:  clear to auscultation bilaterally,  GI: soft, nontender, nondistended, + BS  No hepatomegaly  MS: no deformity Moving all extremities   Skin: warm and dry, no rash Neuro:  Strength and sensation are intact Psych: euthymic mood, full affect   EKG:  EKG is ordered today.  Sinus bradycardia    CT Calcium score 05/22/21  FINDINGS: Coronary arteries: Normal origins.   Coronary Calcium Score:   Left main: 65   Left anterior descending artery: 120   Left circumflex artery: 96   Right coronary artery: 161   Total: 442   Percentile: 54   Pericardium: Normal.   Aorta: Normal caliber of ascending aorta. Aortic atherosclerosis noted.   Non-cardiac: See separate report from [facility].   IMPRESSION: Coronary calcium score of 442. This  was 54th percentile for age-, race-, and sex-matched controls. Aortic atherosclerosis.  Lipid Panel    Component Value Date/Time   CHOL 168 04/23/2021 1008   TRIG 71.0 04/23/2021 1008   HDL 50.40 04/23/2021 1008   CHOLHDL 3 04/23/2021 1008   VLDL 14.2 04/23/2021 1008   LDLCALC 104 (H) 04/23/2021 1008      Wt Readings from Last 3 Encounters:  06/23/21 164 lb 12.8 oz (74.8 kg)  04/23/21 164 lb 3.2 oz (74.5 kg)  02/22/18 167 lb (75.8 kg)      ASSESSMENT AND PLAN:  1  CAD   Pt with recent screening calcium score  noted above   He is very active   No symptoms concerning for angina.    I have recomm he keep ecASA  81 mg    ALso recomm increasing Lipitor for tighter control of LDL    Hold on any ischemic testing   Counselled on symptoms to look for  (angina, anginal equivalents)   Follow  2  Lipids  As noted above  Increase LIpitor Will check lipomed  panel on return  3  Nodule   Small nodule on CT   Will set up for noncontrast CT in 1 year     Plan tentative f/u in 1 year    Sooner for problems     Current medicines are reviewed at length with the patient today.  The patient does not have concerns regarding medicines.  Signed, Dorris Carnes, MD  06/23/2021 10:48 AM    [address], [place], [place]  [address] Phone: [phone]; Fax: [phone]

## 2021-06-25 DIAGNOSIS — R0781 Pleurodynia: Secondary | ICD-10-CM | POA: Insufficient documentation

## 2021-06-26 DIAGNOSIS — M545 Low back pain, unspecified: Secondary | ICD-10-CM | POA: Insufficient documentation

## 2021-07-03 ENCOUNTER — Other Ambulatory Visit: Payer: Self-pay | Admitting: *Deleted

## 2021-07-03 DIAGNOSIS — E785 Hyperlipidemia, unspecified: Secondary | ICD-10-CM

## 2021-07-03 DIAGNOSIS — R918 Other nonspecific abnormal finding of lung field: Secondary | ICD-10-CM

## 2021-07-03 NOTE — Progress Notes (Signed)
Labs to be done at [facility], [facility].   NMR panel in November and BMET next Aug pre chest CT.

## 2021-07-09 DIAGNOSIS — S51011A Laceration without foreign body of right elbow, initial encounter: Secondary | ICD-10-CM | POA: Insufficient documentation

## 2021-08-29 ENCOUNTER — Ambulatory Visit: Payer: Medicare Other | Admitting: Internal Medicine

## 2021-09-22 ENCOUNTER — Other Ambulatory Visit: Payer: Medicare Other

## 2021-10-23 ENCOUNTER — Encounter: Payer: Self-pay | Admitting: Internal Medicine

## 2021-10-23 ENCOUNTER — Ambulatory Visit (INDEPENDENT_AMBULATORY_CARE_PROVIDER_SITE_OTHER): Payer: Medicare Other | Admitting: Internal Medicine

## 2021-10-23 ENCOUNTER — Other Ambulatory Visit: Payer: Self-pay

## 2021-10-23 DIAGNOSIS — I2583 Coronary atherosclerosis due to lipid rich plaque: Secondary | ICD-10-CM | POA: Diagnosis not present

## 2021-10-23 DIAGNOSIS — I700 Atherosclerosis of aorta: Secondary | ICD-10-CM

## 2021-10-23 DIAGNOSIS — I2510 Atherosclerotic heart disease of native coronary artery without angina pectoris: Secondary | ICD-10-CM

## 2021-10-23 DIAGNOSIS — E785 Hyperlipidemia, unspecified: Secondary | ICD-10-CM

## 2021-10-23 NOTE — Assessment & Plan Note (Signed)
On Lipitor - tolerating well Check lipids

## 2021-10-23 NOTE — Assessment & Plan Note (Signed)
Continue Lipitor.  Take aspirin. RTC 6 mo

## 2021-10-23 NOTE — Progress Notes (Signed)
Subjective:  Patient ID: Lee Pruitt, male    DOB: 07-31-1941  Age: 80 y.o. MRN: [id number]  CC: Follow-up (6 MONTH F/U)   HPI Sharee Pimple presents for CAD, aorta atherosclerosis, dyslipidemia  Outpatient Medications Prior to Visit  Medication Sig Dispense Refill   aspirin EC 81 MG tablet Take 1 tablet (81 mg total) by mouth daily. 100 tablet 3   atorvastatin (LIPITOR) 20 MG tablet Take 1 tablet (20 mg total) by mouth at bedtime. Take 1 by mouth at bedtime 90 tablet 3   Cholecalciferol (VITAMIN D3) 50 MCG (2000 UT) capsule Take 1 capsule (2,000 Units total) by mouth daily. 100 capsule 3   escitalopram (LEXAPRO) 5 MG tablet Take 1 tablet (5 mg total) by mouth daily. 30 tablet 5   Naproxen Sodium (ALEVE PO) Take by mouth daily. TAKES ONE TABLET     tadalafil (CIALIS) 10 MG tablet Take by mouth. Take 1/2 -1 tablets by mouth as needed     No facility-administered medications prior to visit.    ROS: Review of Systems  Constitutional:  Negative for appetite change, fatigue and unexpected weight change.  HENT:  Negative for congestion, nosebleeds, sneezing, sore throat and trouble swallowing.   Eyes:  Negative for itching and visual disturbance.  Respiratory:  Negative for cough.   Cardiovascular:  Negative for chest pain, palpitations and leg swelling.  Gastrointestinal:  Negative for abdominal distention, blood in stool, diarrhea and nausea.  Genitourinary:  Negative for frequency and hematuria.  Musculoskeletal:  Negative for back pain, gait problem, joint swelling and neck pain.  Skin:  Negative for rash.  Neurological:  Negative for dizziness, tremors, speech difficulty and weakness.  Psychiatric/Behavioral:  Negative for agitation, dysphoric mood and sleep disturbance. The patient is not nervous/anxious.    Objective:  BP 132/78 (BP Location: Left Arm)    Pulse (!) 48    Temp 97.8 F (36.6 C) (Oral)    Ht 5\' 7"  (1.702 m)    Wt 165 lb 9.6 oz (75.1 kg)    SpO2 98%     BMI 25.94 kg/m   BP Readings from Last 3 Encounters:  10/23/21 132/78  06/23/21 126/78  04/23/21 128/80    Wt Readings from Last 3 Encounters:  10/23/21 165 lb 9.6 oz (75.1 kg)  06/23/21 164 lb 12.8 oz (74.8 kg)  04/23/21 164 lb 3.2 oz (74.5 kg)    Physical Exam Constitutional:      General: He is not in acute distress.    Appearance: He is well-developed.     Comments: NAD  Eyes:     Conjunctiva/sclera: Conjunctivae normal.     Pupils: Pupils are equal, round, and reactive to light.  Neck:     Thyroid: No thyromegaly.     Vascular: No JVD.  Cardiovascular:     Rate and Rhythm: Normal rate and regular rhythm.     Heart sounds: Normal heart sounds. No murmur heard.   No friction rub. No gallop.  Pulmonary:     Effort: Pulmonary effort is normal. No respiratory distress.     Breath sounds: Normal breath sounds. No wheezing or rales.  Chest:     Chest wall: No tenderness.  Abdominal:     General: Bowel sounds are normal. There is no distension.     Palpations: Abdomen is soft. There is no mass.     Tenderness: There is no abdominal tenderness. There is no guarding or rebound.  Musculoskeletal:  General: No tenderness. Normal range of motion.     Cervical back: Normal range of motion.  Lymphadenopathy:     Cervical: No cervical adenopathy.  Skin:    General: Skin is warm and dry.     Findings: No rash.  Neurological:     Mental Status: He is alert and oriented to person, place, and time.     Cranial Nerves: No cranial nerve deficit.     Motor: No abnormal muscle tone.     Coordination: Coordination normal.     Gait: Gait normal.     Deep Tendon Reflexes: Reflexes are normal and symmetric.  Psychiatric:        Behavior: Behavior normal.        Thought Content: Thought content normal.        Judgment: Judgment normal.    Lab Results  Component Value Date   WBC 4.5 04/23/2021   HGB 16.2 04/23/2021   HCT 45.9 04/23/2021   PLT 142.0 (L) 04/23/2021    GLUCOSE 89 04/23/2021   CHOL 168 04/23/2021   TRIG 71.0 04/23/2021   HDL 50.40 04/23/2021   LDLCALC 104 (H) 04/23/2021   ALT 25 04/23/2021   AST 19 04/23/2021   NA 139 04/23/2021   K 4.2 04/23/2021   CL 104 04/23/2021   CREATININE 0.87 04/23/2021   BUN 16 04/23/2021   CO2 27 04/23/2021   TSH 1.44 04/23/2021    CT CARDIAC SCORING (SELF PAY ONLY)  Addendum Date: 05/22/2021   ADDENDUM REPORT: 05/22/2021 17:48 CLINICAL DATA:  Cardiovascular Disease Risk stratification EXAM: Coronary Calcium Score TECHNIQUE: A gated, non-contrast computed tomography scan of the heart was performed using 49mm slice thickness. Axial images were analyzed on a dedicated workstation. Calcium scoring of the coronary arteries was performed using the Agatston method. FINDINGS: Coronary arteries: Normal origins. Coronary Calcium Score: Left main: 65 Left anterior descending artery: 120 Left circumflex artery: 96 Right coronary artery: 161 Total: 442 Percentile: 54 Pericardium: Normal. Aorta: Normal caliber of ascending aorta. Aortic atherosclerosis noted. Non-cardiac: See separate report from [facility]. IMPRESSION: Coronary calcium score of 442. This was 54th percentile for age-, race-, and sex-matched controls. Aortic atherosclerosis. RECOMMENDATIONS: Coronary artery calcium (CAC) score is a strong predictor of incident coronary heart disease (CHD) and provides predictive information beyond traditional risk factors. CAC scoring is reasonable to use in the decision to withhold, postpone, or initiate statin therapy in intermediate-risk or selected borderline-risk asymptomatic adults (age 56-75 years and LDL-C >=70 to <190 mg/dL) who do not have diabetes or established atherosclerotic cardiovascular disease (ASCVD).* In intermediate-risk (10-year ASCVD risk >=7.5% to <20%) adults or selected borderline-risk (10-year ASCVD risk >=5% to <7.5%) adults in whom a CAC score is measured for the purpose of making a treatment  decision the following recommendations have been made: If CAC=0, it is reasonable to withhold statin therapy and reassess in 5 to 10 years, as long as higher risk conditions are absent (diabetes mellitus, family history of premature CHD in first degree relatives (males <55 years; females <65 years), cigarette smoking, or LDL >=190 mg/dL). If CAC is 1 to 99, it is reasonable to initiate statin therapy for patients >=82 years of age. If CAC is >=100 or >=75th percentile, it is reasonable to initiate statin therapy at any age. Cardiology referral should be considered for patients with CAC scores >=400 or >=75th percentile. *2018 AHA/ACC/AACVPR/AAPA/ABC/ACPM/ADA/AGS/APhA/ASPC/NLA/PCNA Guideline on the Management of Blood Cholesterol: A Report of the [employer] of Cardiology/American Heart Association Task Force  on Clinical Practice Guidelines. J Am Coll Cardiol. 2019;73(24):3168-3209. Buford Dresser, MD Electronically Signed   By: Buford Dresser M.D.   On: 05/22/2021 17:48   Result Date: 05/22/2021 EXAM: OVER-READ INTERPRETATION  CT CHEST The following report is an over-read performed by radiologist Dr. Vinnie Langton of [facility], [place] on 05/22/2021. This over-read does not include interpretation of cardiac or coronary anatomy or pathology. The coronary calcium score interpretation by the cardiologist is attached. COMPARISON:  None. FINDINGS: Atherosclerotic calcifications in the thoracic aorta. Tiny pulmonary nodules are noted in the right lower lobe measuring up to 6 x 4 mm (mean diameter 5 mm) on axial image 16 of series 3. Within the visualized portions of the thorax there are no other larger more suspicious appearing pulmonary nodules or masses, there is no acute consolidative airspace disease, no pleural effusions, no pneumothorax and no lymphadenopathy. Visualized portions of the upper abdomen are unremarkable. There are no aggressive appearing lytic or blastic lesions noted in  the visualized portions of the skeleton. IMPRESSION: 1. Small pulmonary nodules measuring 5 mm or less in size, nonspecific, but statistically likely benign. No follow-up needed if patient is low-risk (and has no known or suspected primary neoplasm). Non-contrast chest CT can be considered in 12 months if patient is high-risk. This recommendation follows the consensus statement: Guidelines for Management of Incidental Pulmonary Nodules Detected on CT Images: From the Fleischner Society 2017; Radiology 2017; 284:228-243. 2. Aortic Atherosclerosis (ICD10-I70.0). Electronically Signed: By: Vinnie Langton M.D. On: 05/22/2021 11:01    Assessment & Plan:   Problem List Items Addressed This Visit     Atherosclerosis of aorta ([place])    On Lipitor - tolerating well Check lipids      Coronary artery disease due to lipid rich plaque    On Lipitor - tolerating well Check lipids      Dyslipidemia    Continue Lipitor.  Take aspirin. RTC 6 mo         No orders of the defined types were placed in this encounter.     Follow-up: Return in about 6 months (around 04/23/2022) for Wellness Exam.  Walker Kehr, MD

## 2022-01-23 ENCOUNTER — Other Ambulatory Visit: Payer: Self-pay | Admitting: Urology

## 2022-01-23 DIAGNOSIS — C61 Malignant neoplasm of prostate: Secondary | ICD-10-CM

## 2022-03-03 ENCOUNTER — Ambulatory Visit
Admission: RE | Admit: 2022-03-03 | Discharge: 2022-03-03 | Disposition: A | Discharge status: Home / Self Care | Payer: Medicare Other | Source: Ambulatory Visit | Attending: Urology | Admitting: Urology

## 2022-03-03 DIAGNOSIS — C61 Malignant neoplasm of prostate: Secondary | ICD-10-CM

## 2022-03-03 MED ORDER — GADOBENATE DIMEGLUMINE 529 MG/ML IV SOLN
15.0000 mL | Freq: Once | INTRAVENOUS | Status: AC | PRN
  Administered 2022-03-03: 15 mL via INTRAVENOUS

## 2022-04-07 LAB — PSA: PSA: 5.92

## 2022-04-21 ENCOUNTER — Ambulatory Visit: Payer: Medicare Other | Admitting: Internal Medicine

## 2022-04-28 ENCOUNTER — Encounter: Payer: Self-pay | Admitting: Internal Medicine

## 2022-05-13 ENCOUNTER — Encounter: Payer: Self-pay | Admitting: Internal Medicine

## 2022-05-13 ENCOUNTER — Ambulatory Visit (INDEPENDENT_AMBULATORY_CARE_PROVIDER_SITE_OTHER): Payer: Medicare Other | Admitting: Internal Medicine

## 2022-05-13 DIAGNOSIS — C61 Malignant neoplasm of prostate: Secondary | ICD-10-CM | POA: Diagnosis not present

## 2022-05-13 DIAGNOSIS — E538 Deficiency of other specified B group vitamins: Secondary | ICD-10-CM | POA: Diagnosis not present

## 2022-05-13 DIAGNOSIS — M2550 Pain in unspecified joint: Secondary | ICD-10-CM

## 2022-05-13 DIAGNOSIS — E785 Hyperlipidemia, unspecified: Secondary | ICD-10-CM

## 2022-05-13 LAB — COMPREHENSIVE METABOLIC PANEL
ALT: 27 U/L (ref 0–53)
AST: 20 U/L (ref 0–37)
Albumin: 4.6 g/dL (ref 3.5–5.2)
Alkaline Phosphatase: 68 U/L (ref 39–117)
BUN: 17 mg/dL (ref 6–23)
CO2: 29 mEq/L (ref 19–32)
Calcium: 9.1 mg/dL (ref 8.4–10.5)
Chloride: 104 mEq/L (ref 96–112)
Creatinine, Ser: 0.92 mg/dL (ref 0.40–1.50)
GFR: 78.52 mL/min (ref >60.00)
Glucose, Bld: 95 mg/dL (ref 70–99)
Potassium: 4.5 mEq/L (ref 3.5–5.1)
Sodium: 141 mEq/L (ref 135–145)
Total Bilirubin: 1.1 mg/dL (ref 0.2–1.2)
Total Protein: 6.5 g/dL (ref 6.0–8.3)

## 2022-05-13 LAB — CBC WITH DIFFERENTIAL/PLATELET
Basophils Absolute: 0.1 10*3/uL (ref 0.0–0.1)
Basophils Relative: 1.4 % (ref 0.0–3.0)
Eosinophils Absolute: 0.1 10*3/uL (ref 0.0–0.7)
Eosinophils Relative: 3.1 % (ref 0.0–5.0)
HCT: 46.6 % (ref 39.0–52.0)
Hemoglobin: 16.1 g/dL (ref 13.0–17.0)
Lymphocytes Relative: 22.7 % (ref 12.0–46.0)
Lymphs Abs: 0.9 10*3/uL (ref 0.7–4.0)
MCHC: 34.6 g/dL (ref 30.0–36.0)
MCV: 94.3 fl (ref 78.0–100.0)
Monocytes Absolute: 0.4 10*3/uL (ref 0.1–1.0)
Monocytes Relative: 10.7 % (ref 3.0–12.0)
Neutro Abs: 2.6 10*3/uL (ref 1.4–7.7)
Neutrophils Relative %: 62.1 % (ref 43.0–77.0)
Platelets: 145 10*3/uL — ABNORMAL LOW (ref 150.0–400.0)
RBC: 4.95 Mil/uL (ref 4.22–5.81)
RDW: 13.1 % (ref 11.5–15.5)
WBC: 4.1 10*3/uL (ref 4.0–10.5)

## 2022-05-13 LAB — LIPID PANEL
Cholesterol: 145 mg/dL (ref 0–200)
HDL: 54.3 mg/dL (ref >39.00)
LDL Cholesterol: 79 mg/dL (ref 0–99)
NonHDL: 90.27
Total CHOL/HDL Ratio: 3
Triglycerides: 58 mg/dL (ref 0.0–149.0)
VLDL: 11.6 mg/dL (ref 0.0–40.0)

## 2022-05-13 LAB — URINALYSIS
Bilirubin Urine: NEGATIVE
Hgb urine dipstick: NEGATIVE
Ketones, ur: NEGATIVE
Leukocytes,Ua: NEGATIVE
Nitrite: NEGATIVE
Specific Gravity, Urine: 1.015 (ref 1.000–1.030)
Total Protein, Urine: NEGATIVE
Urine Glucose: NEGATIVE
Urobilinogen, UA: 0.2 (ref 0.0–1.0)
pH: 7 (ref 5.0–8.0)

## 2022-05-13 LAB — TSH: TSH: 1.27 u[IU]/mL (ref 0.35–5.50)

## 2022-05-13 MED ORDER — TADALAFIL 10 MG PO TABS: 10.0000 mg | ORAL_TABLET | Freq: Every day | ORAL | 5 refills | Status: DC | PRN

## 2022-05-13 NOTE — Addendum Note (Signed)
Addended by: Cassandria Anger on: 05/13/2022 12:38 PM   Modules accepted: Orders

## 2022-05-13 NOTE — Progress Notes (Signed)
Subjective:  Patient ID: Lee Pruitt, male    DOB: 12-01-1940  Age: 81 y.o. MRN: [id number]  CC: No chief complaint on file.   HPI DEVIAN BARTOLOMEI presents for CAD, ED, dyslipidemia, OA  Outpatient Medications Prior to Visit  Medication Sig Dispense Refill   aspirin EC 81 MG tablet Take 1 tablet (81 mg total) by mouth daily. 100 tablet 3   atorvastatin (LIPITOR) 20 MG tablet Take 1 tablet (20 mg total) by mouth at bedtime. Take 1 by mouth at bedtime 90 tablet 3   Cholecalciferol (VITAMIN D3) 50 MCG (2000 UT) capsule Take 1 capsule (2,000 Units total) by mouth daily. 100 capsule 3   Naproxen Sodium (ALEVE PO) Take by mouth daily. TAKES ONE TABLET     tadalafil (CIALIS) 10 MG tablet Take by mouth. Take 1/2 -1 tablets by mouth as needed     escitalopram (LEXAPRO) 5 MG tablet Take 1 tablet (5 mg total) by mouth daily. 30 tablet 5   No facility-administered medications prior to visit.    ROS: Review of Systems  Constitutional:  Negative for appetite change, fatigue and unexpected weight change.  HENT:  Negative for congestion, nosebleeds, sneezing, sore throat and trouble swallowing.   Eyes:  Negative for itching and visual disturbance.  Respiratory:  Negative for cough.   Cardiovascular:  Negative for chest pain, palpitations and leg swelling.  Gastrointestinal:  Negative for abdominal distention, blood in stool, diarrhea and nausea.  Genitourinary:  Negative for frequency and hematuria.  Musculoskeletal:  Negative for back pain, gait problem, joint swelling and neck pain.  Skin:  Negative for rash.  Neurological:  Negative for dizziness, tremors, speech difficulty and weakness.  Psychiatric/Behavioral:  Negative for agitation, dysphoric mood and sleep disturbance. The patient is not nervous/anxious.     Objective:  BP 120/82 (BP Location: Left Arm, Patient Position: Sitting, Cuff Size: Large)   Pulse (!) 52   Temp 97.8 F (36.6 C) (Oral)   Ht '5\' 7"'$  (1.702 m)   Wt 160  lb (72.6 kg)   SpO2 90%   BMI 25.06 kg/m   BP Readings from Last 3 Encounters:  05/13/22 120/82  10/23/21 132/78  06/23/21 126/78    Wt Readings from Last 3 Encounters:  05/13/22 160 lb (72.6 kg)  10/23/21 165 lb 9.6 oz (75.1 kg)  06/23/21 164 lb 12.8 oz (74.8 kg)    Physical Exam Constitutional:      General: He is not in acute distress.    Appearance: He is well-developed.     Comments: NAD  Eyes:     Conjunctiva/sclera: Conjunctivae normal.     Pupils: Pupils are equal, round, and reactive to light.  Neck:     Thyroid: No thyromegaly.     Vascular: No JVD.  Cardiovascular:     Rate and Rhythm: Normal rate and regular rhythm.     Heart sounds: Normal heart sounds. No murmur heard.    No friction rub. No gallop.  Pulmonary:     Effort: Pulmonary effort is normal. No respiratory distress.     Breath sounds: Normal breath sounds. No wheezing or rales.  Chest:     Chest wall: No tenderness.  Abdominal:     General: Bowel sounds are normal. There is no distension.     Palpations: Abdomen is soft. There is no mass.     Tenderness: There is no abdominal tenderness. There is no guarding or rebound.  Musculoskeletal:  General: No tenderness. Normal range of motion.     Cervical back: Normal range of motion.  Lymphadenopathy:     Cervical: No cervical adenopathy.  Skin:    General: Skin is warm and dry.     Findings: No rash.  Neurological:     Mental Status: He is alert and oriented to person, place, and time.     Cranial Nerves: No cranial nerve deficit.     Motor: No abnormal muscle tone.     Coordination: Coordination normal.     Gait: Gait normal.     Deep Tendon Reflexes: Reflexes are normal and symmetric.  Psychiatric:        Behavior: Behavior normal.        Thought Content: Thought content normal.        Judgment: Judgment normal.     Lab Results  Component Value Date   WBC 4.5 04/23/2021   HGB 16.2 04/23/2021   HCT 45.9 04/23/2021   PLT  142.0 (L) 04/23/2021   GLUCOSE 89 04/23/2021   CHOL 168 04/23/2021   TRIG 71.0 04/23/2021   HDL 50.40 04/23/2021   LDLCALC 104 (H) 04/23/2021   ALT 25 04/23/2021   AST 19 04/23/2021   NA 139 04/23/2021   K 4.2 04/23/2021   CL 104 04/23/2021   CREATININE 0.87 04/23/2021   BUN 16 04/23/2021   CO2 27 04/23/2021   TSH 1.44 04/23/2021   PSA 5.92 04/06/2022    MR PROSTATE W WO CONTRAST  Result Date: 03/04/2022 CLINICAL DATA:  Elevated PSA level of 5.63 on 08/15/2021. 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 EXAM: MR PROSTATE WITHOUT AND WITH CONTRAST TECHNIQUE: Multiplanar multisequence MRI images were obtained of the pelvis centered about the prostate. Pre and post contrast images were obtained. CONTRAST:  47m MULTIHANCE GADOBENATE DIMEGLUMINE 529 MG/ML IV SOLN COMPARISON:  None Available. FINDINGS: Prostate: Encapsulated nodularity in the transition zone compatible with benign prostatic hypertrophy. Currently no findings of intermediate or higher suspicion for active prostate cancer are identified by MRI. Volume: 3D volumetric analysis: Prostate volume 103.90 cc (6.4 by 5.5 by 6.7 cm). Transcapsular spread:  Absent Seminal vesicle involvement: Absent Neurovascular bundle involvement: Absent Pelvic adenopathy: Absent Bone metastasis: Absent Other findings: Sigmoid colon diverticulosis. IMPRESSION: 1. No current findings of intermediate or higher suspicion for active prostate cancer are identified by MRI. 2. Prostatomegaly and benign prostatic hypertrophy. 3. Sigmoid colon diverticulosis. Electronically Signed   By: WVan ClinesM.D.   On: 03/04/2022 06:58    Assessment & Plan:   Problem List Items Addressed This Visit     Arthralgia     Take vitamin D Pt has been taking Aleve daily x long time Blue-Emu cream was recommended to use 2-3 times a day      Relevant Orders   TSH   Urinalysis   CBC with  Differential/Platelet   Lipid panel   Comprehensive metabolic panel   Dyslipidemia    Cont on  Lipitor.  Take aspirin.  F/u w/Dr RHarrington Challenger     Relevant Orders   TSH   Urinalysis   CBC with Differential/Platelet   Lipid panel   Comprehensive metabolic panel   Low vitamin B12 level    On B12      Prostate cancer ([place]    F/u w/Dr Winter. Watchful waiting. He will let me know if problems. PSA q 6 mo.  Relevant Orders   TSH   Urinalysis   CBC with Differential/Platelet   Lipid panel   Comprehensive metabolic panel      No orders of the defined types were placed in this encounter.     Follow-up: Return in about 6 months (around 11/13/2022) for a follow-up visit.  Walker Kehr, MD

## 2022-05-13 NOTE — Assessment & Plan Note (Signed)
  Take vitamin D Pt has been taking Aleve daily x long time Blue-Emu cream was recommended to use 2-3 times a day

## 2022-05-13 NOTE — Assessment & Plan Note (Signed)
Cont on  Lipitor.  Take aspirin.  F/u w/Dr Harrington Challenger

## 2022-05-13 NOTE — Assessment & Plan Note (Signed)
On B12 

## 2022-05-13 NOTE — Assessment & Plan Note (Addendum)
F/u w/Dr Winter. Watchful waiting. He will let me know if problems. PSA q 6 mo.

## 2022-05-13 NOTE — Patient Instructions (Signed)
Blue-Emu cream -- use 2-3 times a day ? ?

## 2022-06-09 ENCOUNTER — Ambulatory Visit (HOSPITAL_COMMUNITY)
Admission: RE | Admit: 2022-06-09 | Discharge: 2022-06-09 | Disposition: A | Discharge status: Home / Self Care | Payer: Medicare Other | Source: Ambulatory Visit | Attending: Internal Medicine | Admitting: Internal Medicine

## 2022-06-09 DIAGNOSIS — R918 Other nonspecific abnormal finding of lung field: Secondary | ICD-10-CM | POA: Diagnosis present

## 2022-06-24 ENCOUNTER — Other Ambulatory Visit: Payer: Self-pay | Admitting: Internal Medicine

## 2022-07-07 NOTE — Progress Notes (Unsigned)
Cardiology Office Note   Date:  07/08/2022   ID:  Lee Pruitt, DOB 1941-01-16, MRN [id number]  PCP:  Cassandria Anger, MD  Cardiologist:   Dorris Carnes, MD   Patient presents for follow up of CAD    History of Present Illness: Lee Pruitt is a 81 y.o. male with a history of prostate CA, hyperlipidemia  Follows with A Plotnikov  Pt had a calcium score CT in July 2022  Ca Score  was 442 (54% percentile for age, race, sex) Calcium present in LM, lAD, LCx and RCA   Atherosclerosis also seen on aorta.  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  He has occasional cramping in legs   Not every night  ? If related to lipitor   He also questions if related to dehydration, too much wine.      Current Meds  Medication Sig   atorvastatin (LIPITOR) 40 MG tablet Take 1 tablet (40 mg total) by mouth daily.   Cholecalciferol (VITAMIN D3) 50 MCG (2000 UT) capsule Take 1 capsule (2,000 Units total) by mouth daily.   Naproxen Sodium (ALEVE PO) Take by mouth daily. TAKES ONE TABLET   tadalafil (CIALIS) 10 MG tablet Take 1 tablet (10 mg total) by mouth daily as needed for erectile dysfunction. Take 1/2 -1 tablets by mouth as needed   [DISCONTINUED] atorvastatin (LIPITOR) 20 MG tablet Take 1 tablet (20 mg total) by mouth at bedtime.     Allergies:   Patient has no known allergies.   Past Medical History:  Diagnosis Date   Hyperlipidemia    Lipoma    upper right arm    Past Surgical History:  Procedure Laterality Date   COLONOSCOPY     HERNIA REPAIR     POLYPECTOMY       Social History:  The patient  reports that he has quit smoking. He has never used smokeless tobacco. He reports current alcohol use. He reports that he does not use drugs.   Family  History:  The patient's family history includes Heart disease in his father; Ovarian cancer in his sister.    ROS:  Please see the history of present illness. All other systems are reviewed and  Negative to the above problem except as noted.    PHYSICAL EXAM: VS:  BP 130/80   Pulse 65   Ht '5\' 7"'$  (1.702 m)   Wt 162 lb (73.5 kg)   SpO2 95%   BMI 25.37 kg/m   GEN: Well nourished, well developed, in no acute distress  HEENT: normal  Neck: no JVD, no carotid bruits Cardiac: RRR; no murmurs No LE  edema  Respiratory:  clear to auscultation bilaterally,  GI: soft, nontender, nondistended, + BS  No hepatomegaly  MS: no deformity Moving all extremities   Skin: warm and dry, no rash Neuro:  Strength and sensation are intact Psych: euthymic mood, full affect   EKG:  EKG is ordered today   NSR 65 bpm   LAFB  LVH.  Nonspecific ST changes    CT Calcium score 05/22/21  FINDINGS: Coronary arteries: Normal origins.   Coronary Calcium Score:  Left main: 65   Left anterior descending artery: 120   Left circumflex artery: 96   Right coronary artery: 161   Total: 442   Percentile: 54   Pericardium: Normal.   Aorta: Normal caliber of ascending aorta. Aortic atherosclerosis noted.   Non-cardiac: See separate report from [facility].   IMPRESSION: Coronary calcium score of 442. This was 54th percentile for age-, race-, and sex-matched controls. Aortic atherosclerosis.  Lipid Panel    Component Value Date/Time   CHOL 145 05/13/2022 0959   TRIG 58.0 05/13/2022 0959   HDL 54.30 05/13/2022 0959   CHOLHDL 3 05/13/2022 0959   VLDL 11.6 05/13/2022 0959   LDLCALC 79 05/13/2022 0959      Wt Readings from Last 3 Encounters:  07/08/22 162 lb (73.5 kg)  05/13/22 160 lb (72.6 kg)  10/23/21 165 lb 9.6 oz (75.1 kg)      ASSESSMENT AND PLAN:  1  CAD   CAD as noted on CT scan  Score 442. No symptoms to sugg angina   he is taking 81 mg ecASA I would recomm increasing  statin for tighter control of lipids    2  Lipids    Last LDL was 79  HDL 54    I would recomm increasing statin   Switch over to 20 mg Crestor     3  Cramping   I am not convinced related to lipitor  I would expect episodes to be frequent if it was due to this   Could stop but then it would be hard to track improvement since infrequent Stay hydrated      Plan tentative f/u in 1 year    Sooner for problems     Current medicines are reviewed at length with the patient today.  The patient does not have concerns regarding medicines.  Signed, Dorris Carnes, MD  07/08/2022 7:30 PM    [address], [place], [place]  [address] Phone: [phone]; Fax: [phone]

## 2022-07-08 ENCOUNTER — Ambulatory Visit: Payer: Medicare Other | Attending: Internal Medicine | Admitting: Internal Medicine

## 2022-07-08 ENCOUNTER — Encounter: Payer: Self-pay | Admitting: Internal Medicine

## 2022-07-08 VITALS — BP 130/80 | HR 65 | Ht 67.0 in | Wt 162.0 lb

## 2022-07-08 DIAGNOSIS — E785 Hyperlipidemia, unspecified: Secondary | ICD-10-CM | POA: Diagnosis present

## 2022-07-08 DIAGNOSIS — I2510 Atherosclerotic heart disease of native coronary artery without angina pectoris: Secondary | ICD-10-CM | POA: Insufficient documentation

## 2022-07-08 DIAGNOSIS — I2583 Coronary atherosclerosis due to lipid rich plaque: Secondary | ICD-10-CM | POA: Diagnosis present

## 2022-07-08 DIAGNOSIS — Z79899 Other long term (current) drug therapy: Secondary | ICD-10-CM | POA: Diagnosis present

## 2022-07-08 MED ORDER — ATORVASTATIN CALCIUM 40 MG PO TABS: 40.0000 mg | ORAL_TABLET | Freq: Every day | ORAL | 3 refills | Status: DC

## 2022-07-08 NOTE — Patient Instructions (Addendum)
Medication Instructions:  Increase Lipitor to 40 mg daily  *If you need a refill on your cardiac medications before your next appointment, please call your pharmacy*   Lab Work: Labs in 8 weeks:  NMR, APO B, LIPO A, HGBA1C, CK, HEPATIC   If you have labs (blood work) drawn today and your tests are completely normal, you will receive your results only by: [place] (if you have MyChart) OR A paper copy in the mail If you have any lab test that is abnormal or we need to change your treatment, we will call you to review the results.   Testing/Procedures:    Follow-Up: At [facility], you and your health needs are our priority.  As part of our continuing mission to provide you with exceptional heart care, we have created designated Provider Care Teams.  These Care Teams include your primary Cardiologist (physician) and Advanced Practice Providers (APPs -  Physician Assistants and Nurse Practitioners) who all work together to provide you with the care you need, when you need it.  We recommend signing up for the patient portal called "MyChart".  Sign up information is provided on this After Visit Summary.  MyChart is used to connect with patients for Virtual Visits (Telemedicine).  Patients are able to view lab/test results, encounter notes, upcoming appointments, etc.  Non-urgent messages can be sent to your provider as well.   To learn more about what you can do with MyChart, go to [email or web address].    Other Instructions   Important Information About Sugar

## 2022-07-09 ENCOUNTER — Telehealth: Payer: Self-pay | Admitting: Internal Medicine

## 2022-07-09 NOTE — Telephone Encounter (Signed)
Received call from patient  Will call in Rx for Crestor 20 mg to take daily to [facility] 90 day Rx called in   3 refills Pt will need lipomed and liver panel, and ApoB in 8 wks

## 2022-07-10 MED ORDER — ROSUVASTATIN CALCIUM 20 MG PO TABS: 20.0000 mg | ORAL_TABLET | Freq: Every day | ORAL | 3 refills | Status: DC

## 2022-07-10 NOTE — Telephone Encounter (Addendum)
Crestor sent in and labs ordered.. pt already has planned labs for 8 weeks 09/22/22.

## 2022-07-10 NOTE — Addendum Note (Signed)
Addended by: Stephani Police on: 07/10/2022 10:43 AM   Modules accepted: Orders

## 2022-08-03 ENCOUNTER — Telehealth: Payer: Self-pay | Admitting: Internal Medicine

## 2022-08-03 NOTE — Telephone Encounter (Signed)
Left message for patient to call back and schedule Medicare Annual Wellness Visit (AWV).   Please offer to do virtually or by telephone.  Left office number and my jabber [phone].  Last AWV:03/02/2020  Please schedule at anytime with Nurse Health Advisor.

## 2022-09-22 ENCOUNTER — Other Ambulatory Visit: Payer: Medicare Other

## 2022-10-06 ENCOUNTER — Ambulatory Visit: Payer: Medicare Other | Attending: Internal Medicine

## 2022-10-06 DIAGNOSIS — Z79899 Other long term (current) drug therapy: Secondary | ICD-10-CM

## 2022-10-06 DIAGNOSIS — I2510 Atherosclerotic heart disease of native coronary artery without angina pectoris: Secondary | ICD-10-CM

## 2022-10-06 DIAGNOSIS — E785 Hyperlipidemia, unspecified: Secondary | ICD-10-CM

## 2022-10-06 LAB — PSA: PSA: 5.19

## 2022-10-08 ENCOUNTER — Telehealth: Payer: Self-pay

## 2022-10-08 DIAGNOSIS — Z79899 Other long term (current) drug therapy: Secondary | ICD-10-CM

## 2022-10-08 DIAGNOSIS — I2510 Atherosclerotic heart disease of native coronary artery without angina pectoris: Secondary | ICD-10-CM

## 2022-10-08 DIAGNOSIS — E785 Hyperlipidemia, unspecified: Secondary | ICD-10-CM

## 2022-10-08 LAB — APOLIPOPROTEIN B: Apolipoprotein B: 76 mg/dL (ref <90)

## 2022-10-08 LAB — HEPATIC FUNCTION PANEL
ALT: 27 IU/L (ref 0–44)
AST: 21 IU/L (ref 0–40)
Albumin: 4.7 g/dL (ref 3.8–4.8)
Alkaline Phosphatase: 78 IU/L (ref 44–121)
Bilirubin Total: 0.9 mg/dL (ref 0.0–1.2)
Bilirubin, Direct: 0.28 mg/dL (ref 0.00–0.40)
Total Protein: 6.2 g/dL (ref 6.0–8.5)

## 2022-10-08 LAB — NMR, LIPOPROFILE
Cholesterol, Total: 149 mg/dL (ref 100–199)
HDL Particle Number: 37.4 umol/L (ref >=30.5)
HDL-C: 52 mg/dL (ref >39)
LDL Particle Number: 1121 nmol/L — ABNORMAL HIGH (ref <1000)
LDL Size: 20.4 nm — ABNORMAL LOW (ref >20.5)
LDL-C (NIH Calc): 85 mg/dL (ref 0–99)
LP-IR Score: 56 — ABNORMAL HIGH (ref <=45)
Small LDL Particle Number: 578 nmol/L — ABNORMAL HIGH (ref <=527)
Triglycerides: 58 mg/dL (ref 0–149)

## 2022-10-08 LAB — LIPOPROTEIN A (LPA): Lipoprotein (a): 26 nmol/L (ref <75.0)

## 2022-10-08 LAB — HEMOGLOBIN A1C
Est. average glucose Bld gHb Est-mCnc: 94 mg/dL
Hgb A1c MFr Bld: 4.9 % (ref 4.8–5.6)

## 2022-10-08 LAB — CK: Total CK: 68 U/L (ref 41–331)

## 2022-10-08 MED ORDER — EZETIMIBE 10 MG PO TABS: 10.0000 mg | ORAL_TABLET | Freq: Every day | ORAL | 3 refills | Status: DC

## 2022-10-08 NOTE — Telephone Encounter (Signed)
Will follow up with the pt for a lab date.

## 2022-10-08 NOTE — Telephone Encounter (Signed)
I spoke with the pt and he will try the Zetia... he spoke with Dr Harrington Challenger this morning and they had discussed maybe Lipitor instead but he thought more about it and will do Zetia instead.   He is traveling to [place] soon and will be back at some point in Feb/March... I will My Chart him around that time to see if he will have labs here or down in [place].. he is unsure about his travel plans.

## 2022-10-08 NOTE — Telephone Encounter (Signed)
-----   Message from Dorris Carnes V, MD sent at 10/08/2022  8:52 AM EST ----- SPoke to patient re labs  He has been eating more red meat     For now recomm :   Cancel other orders for Zetia Stop Crestor Go back to lipitor   Follow if achy  (he didn't notice difference when switched to Crestor      Increase LIpitor dose to 40 and follow  lipomed, CK and liver panel at end of Feb

## 2022-10-08 NOTE — Telephone Encounter (Signed)
See previous note

## 2022-10-08 NOTE — Telephone Encounter (Signed)
-----   Message from Fay Records, MD sent at 10/07/2022  9:32 PM EST ----- Lipids:  LDL 85   With CAD recomm adding Zetia 10 mg    Follow lipomed in 8 wks with liver  Hgb A1C is great at 4.9 Muscle enzyme is normal

## 2022-10-12 ENCOUNTER — Encounter: Payer: Self-pay | Admitting: Internal Medicine

## 2022-10-20 ENCOUNTER — Encounter: Payer: Self-pay | Admitting: Internal Medicine

## 2022-10-20 MED ORDER — EZETIMIBE 10 MG PO TABS: 10.0000 mg | ORAL_TABLET | Freq: Every day | ORAL | 3 refills | Status: DC

## 2022-10-20 NOTE — Telephone Encounter (Signed)
I spoke with the pt and he will use [facility] at the local [facility] in [place] and will let me know if he has any problems.

## 2022-10-30 ENCOUNTER — Ambulatory Visit: Payer: Medicare Other

## 2022-11-23 ENCOUNTER — Ambulatory Visit: Payer: Medicare Other

## 2022-11-23 ENCOUNTER — Ambulatory Visit (INDEPENDENT_AMBULATORY_CARE_PROVIDER_SITE_OTHER): Payer: Medicare Other

## 2022-11-23 VITALS — Ht 67.0 in | Wt 162.0 lb

## 2022-11-23 DIAGNOSIS — Z0000 Encounter for general adult medical examination without abnormal findings: Secondary | ICD-10-CM | POA: Diagnosis not present

## 2022-11-23 NOTE — Patient Instructions (Signed)
Lee Pruitt , Thank you for taking time to come for your Medicare Wellness Visit. I appreciate your ongoing commitment to your health goals. Please review the following plan we discussed and let me know if I can assist you in the future.   These are the goals we discussed:  Goals      DIET - EAT MORE FRUITS AND VEGETABLES        This is a list of the screening recommended for you and due dates:  Health Maintenance  Topic Date Due   COVID-19 Vaccine (3 - Pfizer risk series) 01/11/2020   Medicare Annual Wellness Visit  11/24/2023   DTaP/Tdap/Td vaccine (2 - Td or Tdap) 03/01/2028   Pneumonia Vaccine  Completed   Zoster (Shingles) Vaccine  Completed   HPV Vaccine  Aged Out   Flu Shot  Discontinued    Advanced directives: no  Conditions/risks identified: none  Next appointment: Follow up in one year for your annual wellness visit. 11/29/23 @ 1 pm by phone  Preventive Care 65 Years and Older, Male  Preventive care refers to lifestyle choices and visits with your health care provider that can promote health and wellness. What does preventive care include? A yearly physical exam. This is also called an annual well check. Dental exams once or twice a year. Routine eye exams. Ask your health care provider how often you should have your eyes checked. Personal lifestyle choices, including: Daily care of your teeth and gums. Regular physical activity. Eating a healthy diet. Avoiding tobacco and drug use. Limiting alcohol use. Practicing safe sex. Taking low doses of aspirin every day. Taking vitamin and mineral supplements as recommended by your health care provider. What happens during an annual well check? The services and screenings done by your health care provider during your annual well check will depend on your age, overall health, lifestyle risk factors, and family history of disease. Counseling  Your health care provider may ask you questions about your: Alcohol  use. Tobacco use. Drug use. Emotional well-being. Home and relationship well-being. Sexual activity. Eating habits. History of falls. Memory and ability to understand (cognition). Work and work Statistician. Screening  You may have the following tests or measurements: Height, weight, and BMI. Blood pressure. Lipid and cholesterol levels. These may be checked every 5 years, or more frequently if you are over 57 years old. Skin check. Lung cancer screening. You may have this screening every year starting at age 41 if you have a 30-pack-year history of smoking and currently smoke or have quit within the past 15 years. Fecal occult blood test (FOBT) of the stool. You may have this test every year starting at age 83. Flexible sigmoidoscopy or colonoscopy. You may have a sigmoidoscopy every 5 years or a colonoscopy every 10 years starting at age 15. Prostate cancer screening. Recommendations will vary depending on your family history and other risks. Hepatitis C blood test. Hepatitis B blood test. Sexually transmitted disease (STD) testing. Diabetes screening. This is done by checking your blood sugar (glucose) after you have not eaten for a while (fasting). You may have this done every 1-3 years. Abdominal aortic aneurysm (AAA) screening. You may need this if you are a current or former smoker. Osteoporosis. You may be screened starting at age 67 if you are at high risk. Talk with your health care provider about your test results, treatment options, and if necessary, the need for more tests. Vaccines  Your health care provider may recommend certain vaccines,  such as: Influenza vaccine. This is recommended every year. Tetanus, diphtheria, and acellular pertussis (Tdap, Td) vaccine. You may need a Td booster every 10 years. Zoster vaccine. You may need this after age 76. Pneumococcal 13-valent conjugate (PCV13) vaccine. One dose is recommended after age 29. Pneumococcal polysaccharide  (PPSV23) vaccine. One dose is recommended after age 71. Talk to your health care provider about which screenings and vaccines you need and how often you need them. This information is not intended to replace advice given to you by your health care provider. Make sure you discuss any questions you have with your health care provider. Document Released: 11/15/2015 Document Revised: 07/08/2016 Document Reviewed: 08/20/2015 Elsevier Interactive Patient Education  2017 [employer] Prevention in the Home Falls can cause injuries. They can happen to people of all ages. There are many things you can do to make your home safe and to help prevent falls. What can I do on the outside of my home? Regularly fix the edges of walkways and driveways and fix any cracks. Remove anything that might make you trip as you walk through a door, such as a raised step or threshold. Trim any bushes or trees on the path to your home. Use bright outdoor lighting. Clear any walking paths of anything that might make someone trip, such as rocks or tools. Regularly check to see if handrails are loose or broken. Make sure that both sides of any steps have handrails. Any raised decks and porches should have guardrails on the edges. Have any leaves, snow, or ice cleared regularly. Use sand or salt on walking paths during winter. Clean up any spills in your garage right away. This includes oil or grease spills. What can I do in the bathroom? Use night lights. Install grab bars by the toilet and in the tub and shower. Do not use towel bars as grab bars. Use non-skid mats or decals in the tub or shower. If you need to sit down in the shower, use a plastic, non-slip stool. Keep the floor dry. Clean up any water that spills on the floor as soon as it happens. Remove soap buildup in the tub or shower regularly. Attach bath mats securely with double-sided non-slip rug tape. Do not have throw rugs and other things on the  floor that can make you trip. What can I do in the bedroom? Use night lights. Make sure that you have a light by your bed that is easy to reach. Do not use any sheets or blankets that are too big for your bed. They should not hang down onto the floor. Have a firm chair that has side arms. You can use this for support while you get dressed. Do not have throw rugs and other things on the floor that can make you trip. What can I do in the kitchen? Clean up any spills right away. Avoid walking on wet floors. Keep items that you use a lot in easy-to-reach places. If you need to reach something above you, use a strong step stool that has a grab bar. Keep electrical cords out of the way. Do not use floor polish or wax that makes floors slippery. If you must use wax, use non-skid floor wax. Do not have throw rugs and other things on the floor that can make you trip. What can I do with my stairs? Do not leave any items on the stairs. Make sure that there are handrails on both sides of the stairs and  use them. Fix handrails that are broken or loose. Make sure that handrails are as long as the stairways. Check any carpeting to make sure that it is firmly attached to the stairs. Fix any carpet that is loose or worn. Avoid having throw rugs at the top or bottom of the stairs. If you do have throw rugs, attach them to the floor with carpet tape. Make sure that you have a light switch at the top of the stairs and the bottom of the stairs. If you do not have them, ask someone to add them for you. What else can I do to help prevent falls? Wear shoes that: Do not have high heels. Have rubber bottoms. Are comfortable and fit you well. Are closed at the toe. Do not wear sandals. If you use a stepladder: Make sure that it is fully opened. Do not climb a closed stepladder. Make sure that both sides of the stepladder are locked into place. Ask someone to hold it for you, if possible. Clearly mark and make  sure that you can see: Any grab bars or handrails. First and last steps. Where the edge of each step is. Use tools that help you move around (mobility aids) if they are needed. These include: Canes. Walkers. Scooters. Crutches. Turn on the lights when you go into a dark area. Replace any light bulbs as soon as they burn out. Set up your furniture so you have a clear path. Avoid moving your furniture around. If any of your floors are uneven, fix them. If there are any pets around you, be aware of where they are. Review your medicines with your doctor. Some medicines can make you feel dizzy. This can increase your chance of falling. Ask your doctor what other things that you can do to help prevent falls. This information is not intended to replace advice given to you by your health care provider. Make sure you discuss any questions you have with your health care provider. Document Released: 08/15/2009 Document Revised: 03/26/2016 Document Reviewed: 11/23/2014 Elsevier Interactive Patient Education  2017 [employer].

## 2022-11-23 NOTE — Progress Notes (Cosign Needed Addendum)
Virtual Visit via Telephone Note  I connected with  Lee Pruitt on 11/23/22 at  2:00 PM EST by telephone and verified that I am speaking with the correct person using two identifiers.  Location: Patient: home Provider: Ether Griffins Persons participating in the virtual visit: [place]   I discussed the limitations, risks, security and privacy concerns of performing an evaluation and management service by telephone and the availability of in person appointments. The patient expressed understanding and agreed to proceed.  Interactive audio and video telecommunications were attempted between this nurse and patient, however failed, due to patient having technical difficulties OR patient did not have access to video capability.  We continued and completed visit with audio only.  Some vital signs may be absent or patient reported.   Dionisio David, LPN  Subjective:   Lee Pruitt is a 82 y.o. male who presents for an Initial Medicare Annual Wellness Visit.  Review of Systems     Cardiac Risk Factors include: advanced age (>68mn, >>63women);male gender;dyslipidemia     Objective:    There were no vitals filed for this visit. There is no height or weight on file to calculate BMI.     11/23/2022    2:09 PM  Advanced Directives  Does Patient Have a Medical Advance Directive? No  Would patient like information on creating a medical advance directive? No - Patient declined    Current Medications (verified) Outpatient Encounter Medications as of 11/23/2022  Medication Sig   Cholecalciferol (VITAMIN D3) 50 MCG (2000 UT) capsule Take 1 capsule (2,000 Units total) by mouth daily.   ezetimibe (ZETIA) 10 MG tablet Take 1 tablet (10 mg total) by mouth daily.   Naproxen Sodium (ALEVE PO) Take by mouth daily. TAKES ONE TABLET   rosuvastatin (CRESTOR) 20 MG tablet Take 1 tablet (20 mg total) by mouth daily.   tadalafil (CIALIS) 10 MG tablet Take 1 tablet (10 mg  total) by mouth daily as needed for erectile dysfunction. Take 1/2 -1 tablets by mouth as needed   No facility-administered encounter medications on file as of 11/23/2022.    Allergies (verified) Patient has no known allergies.   History: Past Medical History:  Diagnosis Date   Hyperlipidemia    Lipoma    upper right arm   Past Surgical History:  Procedure Laterality Date   COLONOSCOPY     HERNIA REPAIR     POLYPECTOMY     Family History  Problem Relation Age of Onset   Heart disease Father        CAD, AA   Ovarian cancer Sister    Colon cancer Neg Hx    Esophageal cancer Neg Hx    Rectal cancer Neg Hx    Stomach cancer Neg Hx    Social History   Socioeconomic History   Marital status: Married    Spouse name: Not on file   Number of children: Not on file   Years of education: Not on file   Highest education level: Not on file  Occupational History   Not on file  Tobacco Use   Smoking status: Former   Smokeless tobacco: Never   Tobacco comments:    quit at age 82 Substance and Sexual Activity   Alcohol use: Yes    Alcohol/week: 0.0 standard drinks of alcohol    Comment: wine or liquor daily   Drug use: Never   Sexual activity: Not on file  Other Topics Concern  Not on file  Social History Narrative   Not on file   Social Determinants of Health   Financial Resource Strain: Low Risk  (11/23/2022)   Overall Financial Resource Strain (CARDIA)    Difficulty of Paying Living Expenses: Not hard at all  Food Insecurity: No Food Insecurity (11/23/2022)   Hunger Vital Sign    Worried About Running Out of Food in the Last Year: Never true    Ran Out of Food in the Last Year: Never true  Transportation Needs: No Transportation Needs (11/23/2022)   PRAPARE - Hydrologist (Medical): No    Lack of Transportation (Non-Medical): No  Physical Activity: Sufficiently Active (11/23/2022)   Exercise Vital Sign    Days of Exercise per Week: 5  days    Minutes of Exercise per Session: 40 min  Stress: No Stress Concern Present (11/23/2022)   [place]    Feeling of Stress : Not at all  Social Connections: Moderately Isolated (11/23/2022)   Social Connection and Isolation Panel [NHANES]    Frequency of Communication with Friends and Family: More than three times a week    Frequency of Social Gatherings with Friends and Family: More than three times a week    Attends Religious Services: Never    Marine scientist or Organizations: No    Attends Music therapist: Never    Marital Status: Married    Tobacco Counseling Counseling given: Not Answered Tobacco comments: quit at age 50   Clinical Intake:  Pre-visit preparation completed: Yes  Pain : No/denies pain     Nutritional Risks: None Diabetes: No  How often do you need to have someone help you when you read instructions, pamphlets, or other written materials from your doctor or pharmacy?: 1 - Never  Diabetic?no  Interpreter Needed?: No  Information entered by :: Kirke Shaggy, LPN   Activities of Daily Living    11/23/2022    2:09 PM  In your present state of health, do you have any difficulty performing the following activities:  Hearing? 1  Vision? 0  Difficulty concentrating or making decisions? 0  Walking or climbing stairs? 0  Dressing or bathing? 0  Doing errands, shopping? 0  Preparing Food and eating ? N  Using the Toilet? N  In the past six months, have you accidently leaked urine? N  Do you have problems with loss of bowel control? N  Managing your Medications? N  Managing your Finances? N  Housekeeping or managing your Housekeeping? N    Patient Care Team: Plotnikov, Evie Lacks, MD as PCP - General (Internal Medicine)  Indicate any recent Medical Services you may have received from other than [facility] providers in the past year (date may be approximate).      Assessment:   This is a routine wellness examination for Lee Pruitt.  Hearing/Vision screen Hearing Screening - Comments:: No aids Vision Screening - Comments:: Wears glasses- [place] [facility]  Dietary issues and exercise activities discussed: Current Exercise Habits: Home exercise routine, Type of exercise: walking, Time (Minutes): 40, Frequency (Times/Week): 5, Weekly Exercise (Minutes/Week): 200, Intensity: Mild   Goals Addressed             This Visit's Progress    DIET - EAT MORE FRUITS AND VEGETABLES         Depression Screen    11/23/2022    2:07 PM 05/13/2022    9:44 AM  04/23/2021    9:19 AM  PHQ 2/9 Scores  PHQ - 2 Score 0 0 0  PHQ- 9 Score 0  0    Fall Risk    11/23/2022    2:09 PM 05/13/2022    9:43 AM 04/23/2021    9:19 AM  Fall Risk   Falls in the past year? 0 1 0  Number falls in past yr: 0 0 0  Injury with Fall? 0 1 0  Risk for fall due to : No Fall Risks No Fall Risks;History of fall(s) No Fall Risks  Follow up Falls prevention discussed;Falls evaluation completed Falls evaluation completed     FALL RISK PREVENTION PERTAINING TO THE HOME:  Any stairs in or around the home? Yes  If so, are there any without handrails? No  Home free of loose throw rugs in walkways, pet beds, electrical cords, etc? Yes  Adequate lighting in your home to reduce risk of falls? Yes   ASSISTIVE DEVICES UTILIZED TO PREVENT FALLS:  Life alert? No  Use of a cane, walker or w/c? No  Grab bars in the bathroom? Yes  Shower chair or bench in shower? Yes  Elevated toilet seat or a handicapped toilet? Yes    Cognitive Function:        11/23/2022    2:12 PM  6CIT Screen  What Year? 0 points  What month? 0 points  What time? 0 points  Count back from 20 0 points  Months in reverse 0 points  Repeat phrase 0 points  Total Score 0 points    Immunizations Immunization History  Administered Date(s) Administered   Influenza, High Dose Seasonal PF 11/03/2017, 11/03/2017    PFIZER(Purple Top)SARS-COV-2 Vaccination 11/22/2019, 12/14/2019   Pneumococcal Conjugate-13 11/03/2017   Pneumococcal Polysaccharide-23 03/23/2019   Tdap 03/01/2018   Zoster Recombinat (Shingrix) 03/01/2018, 05/03/2018    TDAP status: Up to date  Flu Vaccine status: Declined, Education has been provided regarding the importance of this vaccine but patient still declined. Advised may receive this vaccine at local pharmacy or Health Dept. Aware to provide a copy of the vaccination record if obtained from local pharmacy or Health Dept. Verbalized acceptance and understanding.  Pneumococcal vaccine status: Up to date  Covid-19 vaccine status: Completed vaccines  Qualifies for Shingles Vaccine? Yes   Zostavax completed No   Shingrix Completed?: Yes  Screening Tests Health Maintenance  Topic Date Due   COVID-19 Vaccine (3 - Pfizer risk series) 01/11/2020   Medicare Annual Wellness (AWV)  11/24/2023   DTaP/Tdap/Td (2 - Td or Tdap) 03/01/2028   Pneumonia Vaccine 70+ Years old  Completed   Zoster Vaccines- Shingrix  Completed   HPV VACCINES  Aged Out   INFLUENZA VACCINE  Discontinued    Health Maintenance  Health Maintenance Due  Topic Date Due   COVID-19 Vaccine (3 - Pfizer risk series) 01/11/2020    Colorectal cancer screening: No longer required.   Lung Cancer Screening: (Low Dose CT Chest recommended if Age 34-80 years, 30 pack-year currently smoking OR have quit w/in 15years.) does not qualify.   Additional Screening:  Hepatitis C Screening: does not qualify; Completed no  Vision Screening: Recommended annual ophthalmology exams for early detection of glaucoma and other disorders of the eye. Is the patient up to date with their annual eye exam?  Yes  Who is the provider or what is the name of the office in which the patient attends annual eye exams? [facility] If pt is not established  with a provider, would they like to be referred to a provider to establish care?  No .   Dental Screening: Recommended annual dental exams for proper oral hygiene  Community Resource Referral / Chronic Care Management: CRR required this visit?  No   CCM required this visit?  No      Plan:     I have personally reviewed and noted the following in the patient's chart:   Medical and social history Use of alcohol, tobacco or illicit drugs  Current medications and supplements including opioid prescriptions. Patient is not currently taking opioid prescriptions. Functional ability and status Nutritional status Physical activity Advanced directives List of other physicians Hospitalizations, surgeries, and ER visits in previous 12 months Vitals Screenings to include cognitive, depression, and falls Referrals and appointments  In addition, I have reviewed and discussed with patient certain preventive protocols, quality metrics, and best practice recommendations. A written personalized care plan for preventive services as well as general preventive health recommendations were provided to patient.     Dionisio David, LPN   7/74/1287   Nurse Notes: none    Medical screening examination/treatment/procedure(s) were performed by non-physician practitioner and as supervising physician I was immediately available for consultation/collaboration.  I agree with above. Lew Dawes, MD

## 2022-12-08 NOTE — Telephone Encounter (Signed)
My Chart message sent to the pt as we discussed 2 months ago re: needing labs this month for his chol.Marland KitchenMarland KitchenNMR and Hepatic.... pt may still be in [place] but My Chart sent to touch base with him to be sure we plan when he returns.

## 2023-02-25 ENCOUNTER — Other Ambulatory Visit: Payer: Self-pay | Admitting: Urology

## 2023-02-25 DIAGNOSIS — C61 Malignant neoplasm of prostate: Secondary | ICD-10-CM

## 2023-03-11 ENCOUNTER — Ambulatory Visit: Payer: Medicare Other | Attending: Internal Medicine

## 2023-03-11 DIAGNOSIS — I2510 Atherosclerotic heart disease of native coronary artery without angina pectoris: Secondary | ICD-10-CM

## 2023-03-11 DIAGNOSIS — Z79899 Other long term (current) drug therapy: Secondary | ICD-10-CM

## 2023-03-11 DIAGNOSIS — E785 Hyperlipidemia, unspecified: Secondary | ICD-10-CM

## 2023-03-11 LAB — HEPATIC FUNCTION PANEL
ALT: 30 IU/L (ref 0–44)
AST: 21 IU/L (ref 0–40)
Albumin: 4.3 g/dL (ref 3.7–4.7)
Alkaline Phosphatase: 76 IU/L (ref 44–121)
Bilirubin Total: 0.8 mg/dL (ref 0.0–1.2)
Bilirubin, Direct: 0.28 mg/dL (ref 0.00–0.40)
Total Protein: 6.2 g/dL (ref 6.0–8.5)

## 2023-03-11 LAB — NMR, LIPOPROFILE

## 2023-03-12 ENCOUNTER — Telehealth: Payer: Self-pay

## 2023-03-12 LAB — NMR, LIPOPROFILE
Cholesterol, Total: 111 mg/dL (ref 100–199)
HDL Particle Number: 36.2 umol/L (ref >=30.5)
HDL-C: 58 mg/dL (ref >39)
LDL Particle Number: 518 nmol/L (ref <1000)
LDL Size: 19.8 nm — ABNORMAL LOW (ref >20.5)
LP-IR Score: 41 (ref <=45)
Small LDL Particle Number: 423 nmol/L (ref <=527)

## 2023-03-12 NOTE — Telephone Encounter (Signed)
Need appt with Dr Ross  

## 2023-03-16 NOTE — Telephone Encounter (Signed)
Left a message for the pt to see if he would like to see Dr Tenny Craw today since she had a cancellation.

## 2023-03-19 NOTE — Telephone Encounter (Signed)
Appt made for 04/02/23.

## 2023-03-31 NOTE — Progress Notes (Signed)
Cardiology Office Note   Date:  04/02/2023   ID:  Lee Pruitt, DOB Oct 08, 1941, MRN [id number]  PCP:  Tresa Garter, MD  Cardiologist:   Dietrich Pates, MD   Patient presents for follow up of CAD    History of Present Illness: Lee Pruitt is a 82 y.o. male with a history of prostate CA, hyperlipidemia  Follows with A Plotnikov  Pt had a calcium score CT in July 2022  Ca Score  was 442 (54% percentile for age, race, sex) Calcium present in LM, lAD, LCx and RCA   Atherosclerosis also seen on aorta.  A small pulmonary nodule also noted   I saw  the pt in Sept 2023  Since seen he has done well  Breathing is OK   No dizziness   Does note that his balance is off at times    Very active   Lives in the [address] area  Lots of outdoor activities  Weight down   Feels good   Current Meds  Medication Sig   Cholecalciferol (VITAMIN D3) 50 MCG (2000 UT) capsule Take 1 capsule (2,000 Units total) by mouth daily.   ezetimibe (ZETIA) 10 MG tablet Take 1 tablet (10 mg total) by mouth daily.   Naproxen Sodium (ALEVE PO) Take by mouth daily. TAKES ONE TABLET   rosuvastatin (CRESTOR) 20 MG tablet Take 1 tablet (20 mg total) by mouth daily.   tadalafil (CIALIS) 10 MG tablet Take 1 tablet (10 mg total) by mouth daily as needed for erectile dysfunction. Take 1/2 -1 tablets by mouth as needed     Allergies:   Patient has no known allergies.   Past Medical History:  Diagnosis Date   Hyperlipidemia    Lipoma    upper right arm    Past Surgical History:  Procedure Laterality Date   COLONOSCOPY     HERNIA REPAIR     POLYPECTOMY       Social History:  The patient  reports that he has quit smoking. He has never used smokeless tobacco. He reports current alcohol use. He reports that he does not use drugs.   Family History:  The patient's family history includes Heart disease in his father; Ovarian cancer in his sister.    ROS:  Please see the history of present illness. All other  systems are reviewed and  Negative to the above problem except as noted.    PHYSICAL EXAM: VS:  BP 122/78   Pulse (!) 55   Ht 5\' 7"  (1.702 m)   Wt 158 lb 9.6 oz (71.9 kg)   SpO2 98%   BMI 24.84 kg/m   GEN: 82 yo in no acute distress  HEENT: normal  Neck: no JVD, no carotid bruit Cardiac: RRR  No murmur  No LE  edema  Respiratory:  clear to auscultation  GI: soft, nontender, no masses  No hepatomegaly  MS: no deformity es   Skin: warm and dry, no rash   EKG:  EKG is ordered today   NSR 65 bpm   LAFB  LVH.  Nonspecific ST changes    CT Calcium score 05/22/21  FINDINGS: Coronary arteries: Normal origins.   Coronary Calcium Score:   Left main: 65   Left anterior descending artery: 120   Left circumflex artery: 96   Right coronary artery: 161   Total: 442   Percentile: 54   Pericardium: Normal.   Aorta: Normal caliber of ascending aorta. Aortic atherosclerosis noted.  Non-cardiac: See separate report from [facility].   IMPRESSION: Coronary calcium score of 442. This was 54th percentile for age-, race-, and sex-matched controls. Aortic atherosclerosis.  Lipid Panel    Component Value Date/Time   CHOL 145 05/13/2022 0959   TRIG 58.0 05/13/2022 0959   HDL 54.30 05/13/2022 0959   CHOLHDL 3 05/13/2022 0959   VLDL 11.6 05/13/2022 0959   LDLCALC 79 05/13/2022 0959      Wt Readings from Last 3 Encounters:  04/02/23 158 lb 9.6 oz (71.9 kg)  11/23/22 162 lb (73.5 kg)  07/08/22 162 lb (73.5 kg)      ASSESSMENT AND PLAN:  1  CAD     Ca score CT in 2022   Score was 442  (54%ile for age)  Control risk factors    Stay active   Watch diet   2  LIpids   REcent panel was excellent   LDL 43 with low particle number      On Crestor 20 and Zetia 10   After review would keep on same regimen 3  Hx of cramping   Denies   4  Balance   notes some unsteadiness   Would recomm working on  He will look into this   Plan for follow up next winter      Current medicines are reviewed at length with the patient today.  The patient does not have concerns regarding medicines.  Signed, Dietrich Pates, MD  04/02/2023 11:35 AM    [facility] [address] [place], [place], [place]  [address] Phone: [phone]; Fax: [phone]

## 2023-04-02 ENCOUNTER — Ambulatory Visit: Payer: Medicare Other | Attending: Internal Medicine | Admitting: Internal Medicine

## 2023-04-02 ENCOUNTER — Encounter: Payer: Self-pay | Admitting: Internal Medicine

## 2023-04-02 VITALS — BP 122/78 | HR 55 | Ht 67.0 in | Wt 158.6 lb

## 2023-04-02 DIAGNOSIS — I2510 Atherosclerotic heart disease of native coronary artery without angina pectoris: Secondary | ICD-10-CM | POA: Insufficient documentation

## 2023-04-02 NOTE — Patient Instructions (Signed)
Medication Instructions:   *If you need a refill on your cardiac medications before your next appointment, please call your pharmacy*   Lab Work:  If you have labs (blood work) drawn today and your tests are completely normal, you will receive your results only by: MyChart Message (if you have MyChart) OR A paper copy in the mail If you have any lab test that is abnormal or we need to change your treatment, we will call you to review the results.   Testing/Procedures:    Follow-Up: At [facility], you and your health needs are our priority.  As part of our continuing mission to provide you with exceptional heart care, we have created designated Provider Care Teams.  These Care Teams include your primary Cardiologist (physician) and Advanced Practice Providers (APPs -  Physician Assistants and Nurse Practitioners) who all work together to provide you with the care you need, when you need it.  We recommend signing up for the patient portal called "MyChart".  Sign up information is provided on this After Visit Summary.  MyChart is used to connect with patients for Virtual Visits (Telemedicine).  Patients are able to view lab/test results, encounter notes, upcoming appointments, etc.  Non-urgent messages can be sent to your provider as well.   To learn more about what you can do with MyChart, go to [email or web address].    Your next appointment:   Dr Dietrich Pates     Other Instructions

## 2023-04-22 ENCOUNTER — Ambulatory Visit
Admission: RE | Admit: 2023-04-22 | Discharge: 2023-04-22 | Disposition: A | Discharge status: Home / Self Care | Payer: Medicare Other | Source: Ambulatory Visit | Attending: Urology | Admitting: Urology

## 2023-04-22 DIAGNOSIS — C61 Malignant neoplasm of prostate: Secondary | ICD-10-CM

## 2023-04-22 MED ORDER — GADOPICLENOL 0.5 MMOL/ML IV SOLN
7.0000 mL | Freq: Once | INTRAVENOUS | Status: AC | PRN
  Administered 2023-04-22: 7 mL via INTRAVENOUS

## 2023-07-11 ENCOUNTER — Other Ambulatory Visit: Payer: Self-pay | Admitting: Internal Medicine

## 2023-09-06 ENCOUNTER — Telehealth: Payer: Self-pay | Admitting: Internal Medicine

## 2023-09-06 NOTE — Telephone Encounter (Signed)
Prescription Request  09/06/2023  LOV: Visit date not found  What is the name of the medication or equipment? cialis  Have you contacted your pharmacy to request a refill? Yes   Which pharmacy would you like this sent to?  [facility] in [place], [place] - [address], in [place], [place]   Patient notified that their request is being sent to the clinical staff for review and that they should receive a response within 2 business days.   Please advise at Mobile [phone] (mobile)

## 2023-09-07 MED ORDER — TADALAFIL 10 MG PO TABS: 10.0000 mg | ORAL_TABLET | Freq: Every day | ORAL | 0 refills | Status: DC | PRN

## 2023-09-07 NOTE — Telephone Encounter (Signed)
Prescription emailed. He is due office visit.  Thank you

## 2023-10-04 NOTE — Progress Notes (Unsigned)
Cardiology Office Note   Date:  10/05/2023   ID:  Lee Pruitt, DOB 01-26-1941, MRN [id number]  PCP:  Tresa Garter, MD  Cardiologist:   Dietrich Pates, MD   Patient presents for follow up of CAD    History of Present Illness: Lee Pruitt is a 82 y.o. male with a history of prostate CA, hyperlipidemia  Follows with A Plotnikov  Pt had a calcium score CT in July 2022  Ca Score  was 442 (54% percentile for age, race, sex) Calcium present in LM, lAD, LCx and RCA   Atherosclerosis also seen on aorta.  A small pulmonary nodule also noted    I last saw the pt in May 2024  Since seen he has done well  He denies CP  Breathing is good   No dizziness   REmains very active outdoors     Hsa had some difficulty sleeping   Using Unasom  for now   Will try to taper back  Current Meds  Medication Sig   Cholecalciferol (VITAMIN D3) 50 MCG (2000 UT) capsule Take 1 capsule (2,000 Units total) by mouth daily.   ezetimibe (ZETIA) 10 MG tablet Take 1 tablet (10 mg total) by mouth daily.   Naproxen Sodium (ALEVE PO) Take by mouth daily. TAKES ONE TABLET   rosuvastatin (CRESTOR) 20 MG tablet TAKE 1 TABLET BY MOUTH EVERY DAY   tadalafil (CIALIS) 10 MG tablet Take 1 tablet (10 mg total) by mouth daily as needed for erectile dysfunction. Take 1/2 -1 tablets by mouth as needed     Allergies:   Patient has no known allergies.   Past Medical History:  Diagnosis Date   Hyperlipidemia    Lipoma    upper right arm    Past Surgical History:  Procedure Laterality Date   COLONOSCOPY     HERNIA REPAIR     POLYPECTOMY       Social History:  The patient  reports that he has quit smoking. He has never used smokeless tobacco. He reports current alcohol use. He reports that he does not use drugs.   Family History:  The patient's family history includes Heart disease in his father; Ovarian cancer in his sister.    ROS:  Please see the history of present illness. All other systems are reviewed  and  Negative to the above problem except as noted.    PHYSICAL EXAM: VS:  BP 130/72   Pulse (!) 49   Ht 5\' 7"  (1.702 m)   Wt 164 lb 12.8 oz (74.8 kg)   SpO2 98%   BMI 25.81 kg/m   GEN: 82 yo in no acute distress  HEENT: normal  Neck: no JVD, no carotid bruit Cardiac: RRR  No murmurs  No  LE  edema  Respiratory:  clear to auscultation  GI: soft, nontender,  No hepatomegaly  MS: no deformity Skin: warm and dry, no rash   EKG:  EKG is ordered today   sinus bradycardia  49 bpm   LAFB     Ca score CT  July 2022   FINDINGS: Coronary arteries: Normal origins.  Coronary Calcium Score: Left main: 65 Left anterior descending artery: 120 Left circumflex artery: 96  Right coronary artery: 161   Total: 442  Percentile: 54  Pericardium: Normal.  Aorta: Normal caliber of ascending aorta. Aortic atherosclerosis Noted=y.   IMPRESSION: Coronary calcium score of 442. This was 54th percentile for age-, race-, and sex-matched controls. Aortic atherosclerosis.  Lipid Panel    Component Value Date/Time   CHOL 145 05/13/2022 0959   TRIG 58.0 05/13/2022 0959   HDL 54.30 05/13/2022 0959   CHOLHDL 3 05/13/2022 0959   VLDL 11.6 05/13/2022 0959   LDLCALC 79 05/13/2022 0959      Wt Readings from Last 3 Encounters:  10/05/23 164 lb 12.8 oz (74.8 kg)  04/02/23 158 lb 9.6 oz (71.9 kg)  11/23/22 162 lb (73.5 kg)      ASSESSMENT AND PLAN:  1  CAD     Ca score CT in 2022   Score was 442  (54%ile for age) ]No symptoms of angina  Stay active  Control risk factors     2  LIpids   REcent panel was excellent   LDL 43 with low particle number      On Crestor 20 and Zetia 10   After review would keep on same regimen  3  Hx of cramping   Denies        Current medicines are reviewed at length with the patient today.  The patient does not have concerns regarding medicines.  Signed, Dietrich Pates, MD  10/05/2023 9:22 PM    [facility] [address] [place],  [place], [place]  [address] Phone: [phone]; Fax: [phone]

## 2023-10-05 ENCOUNTER — Ambulatory Visit: Payer: Medicare Other | Attending: Internal Medicine | Admitting: Internal Medicine

## 2023-10-05 ENCOUNTER — Encounter: Payer: Self-pay | Admitting: Internal Medicine

## 2023-10-05 VITALS — BP 130/72 | HR 49 | Ht 67.0 in | Wt 164.8 lb

## 2023-10-05 DIAGNOSIS — I700 Atherosclerosis of aorta: Secondary | ICD-10-CM | POA: Diagnosis not present

## 2023-10-05 NOTE — Patient Instructions (Signed)
Medication Instructions:   *If you need a refill on your cardiac medications before your next appointment, please call your pharmacy*   Lab Work:  If you have labs (blood work) drawn today and your tests are completely normal, you will receive your results only by: MyChart Message (if you have MyChart) OR A paper copy in the mail If you have any lab test that is abnormal or we need to change your treatment, we will call you to review the results.   Testing/Procedures:    Follow-Up: At [facility], you and your health needs are our priority.  As part of our continuing mission to provide you with exceptional heart care, we have created designated Provider Care Teams.  These Care Teams include your primary Cardiologist (physician) and Advanced Practice Providers (APPs -  Physician Assistants and Nurse Practitioners) who all work together to provide you with the care you need, when you need it.  We recommend signing up for the patient portal called "MyChart".  Sign up information is provided on this After Visit Summary.  MyChart is used to connect with patients for Virtual Visits (Telemedicine).  Patients are able to view lab/test results, encounter notes, upcoming appointments, etc.  Non-urgent messages can be sent to your provider as well.   To learn more about what you can do with MyChart, go to [email or web address].    Your next appointment:

## 2023-10-19 ENCOUNTER — Telehealth: Payer: Self-pay | Admitting: Internal Medicine

## 2023-10-19 MED ORDER — EZETIMIBE 10 MG PO TABS: 10.0000 mg | ORAL_TABLET | Freq: Every day | ORAL | 3 refills | Status: DC

## 2023-10-19 NOTE — Telephone Encounter (Signed)
*  STAT* If patient is at the pharmacy, call can be transferred to refill team.   1. Which medications need to be refilled? (please list name of each medication and dose if known)   ezetimibe (ZETIA) 10 MG tablet   2. Would you like to learn more about the convenience, safety, & potential cost savings by using the [facility]?   3. Are you open to using the [facility] (Type [facility]. ).  4. Which pharmacy/location (including street and city if local pharmacy) is medication to be sent to?  [id number] - [place], [place] - [address] AT [facility] [address]   5. Do they need a 30 day or 90 day supply?  90 day  Patient stated he still has a couple of tablets left.

## 2023-10-19 NOTE — Telephone Encounter (Signed)
Pt's medication was sent to pt's pharmacy as requested. Confirmation received.  °

## 2023-11-29 ENCOUNTER — Ambulatory Visit (INDEPENDENT_AMBULATORY_CARE_PROVIDER_SITE_OTHER): Payer: Medicare Other

## 2023-11-29 VITALS — Ht 67.0 in | Wt 158.0 lb

## 2023-11-29 DIAGNOSIS — Z0000 Encounter for general adult medical examination without abnormal findings: Secondary | ICD-10-CM

## 2023-11-29 NOTE — Patient Instructions (Signed)
Mr. Willinger , Thank you for taking time to come for your Medicare Wellness Visit. I appreciate your ongoing commitment to your health goals. Please review the following plan we discussed and let me know if I can assist you in the future.   Referrals/Orders/Follow-Ups/Clinician Recommendations: It was talking with you today.  Keep up the good work.  This is a list of the screening recommended for you and due dates:  Health Maintenance  Topic Date Due   COVID-19 Vaccine (3 - Pfizer risk series) 01/11/2020   Medicare Annual Wellness Visit  11/24/2023   DTaP/Tdap/Td vaccine (2 - Td or Tdap) 03/01/2028   Pneumonia Vaccine  Completed   Zoster (Shingles) Vaccine  Completed   HPV Vaccine  Aged Out   Flu Shot  Discontinued    Advanced directives: (Copy Requested) Please bring a copy of your health care power of attorney and living will to the office to be added to your chart at your convenience.  Next Medicare Annual Wellness Visit scheduled for next year: Yes

## 2023-11-29 NOTE — Progress Notes (Addendum)
Subjective:   Lee Pruitt is a 83 y.o. male who presents for Medicare Annual/Subsequent preventive examination.  Visit Complete: Virtual I connected with  Lee Pruitt on 11/29/23 by a audio enabled telemedicine application and verified that I am speaking with the correct person using two identifiers.  Patient Location: Home  Provider Location: Office/Clinic  I discussed the limitations of evaluation and management by telemedicine. The patient expressed understanding and agreed to proceed.  Vital Signs: Because this visit was a virtual/telehealth visit, some criteria may be missing or patient reported. Any vitals not documented were not able to be obtained and vitals that have been documented are patient reported.  Patient Medicare AWV questionnaire was completed by the patient on 11/26/2023; I have confirmed that all information answered by patient is correct and no changes since this date.  Cardiac Risk Factors include: advanced age (>90men, >41 women);male gender;dyslipidemia;Other (see comment), Risk factor comments: CAD, BPH, prostate cancer     Objective:    Today's Vitals   11/29/23 1233  Weight: 158 lb (71.7 kg)  Height: 5\' 7"  (1.702 m)   Body mass index is 24.75 kg/m.     11/29/2023    1:06 PM 11/23/2022    2:09 PM  Advanced Directives  Does Patient Have a Medical Advance Directive? Yes No  Type of Estate agent of [place];Living will   Copy of Healthcare Power of Attorney in Chart? No - copy requested   Would patient like information on creating a medical advance directive?  No - Patient declined    Current Medications (verified) Outpatient Encounter Medications as of 11/29/2023  Medication Sig   Cholecalciferol (VITAMIN D3) 50 MCG (2000 UT) capsule Take 1 capsule (2,000 Units total) by mouth daily.   ezetimibe (ZETIA) 10 MG tablet Take 1 tablet (10 mg total) by mouth daily.   Naproxen Sodium (ALEVE PO) Take by mouth daily. TAKES  ONE TABLET   rosuvastatin (CRESTOR) 20 MG tablet TAKE 1 TABLET BY MOUTH EVERY DAY   tadalafil (CIALIS) 10 MG tablet Take 1 tablet (10 mg total) by mouth daily as needed for erectile dysfunction. Take 1/2 -1 tablets by mouth as needed   No facility-administered encounter medications on file as of 11/29/2023.    Allergies (verified) Patient has no known allergies.   History: Past Medical History:  Diagnosis Date   Hyperlipidemia    Lipoma    upper right arm   Past Surgical History:  Procedure Laterality Date   COLONOSCOPY     HERNIA REPAIR     POLYPECTOMY     Family History  Problem Relation Age of Onset   Heart disease Father        CAD, AA   Ovarian cancer Sister    Colon cancer Neg Hx    Esophageal cancer Neg Hx    Rectal cancer Neg Hx    Stomach cancer Neg Hx    Social History   Socioeconomic History   Marital status: Married    Spouse name: Lee Pruitt   Number of children: Not on file   Years of education: Not on file   Highest education level: Not on file  Occupational History   Occupation: RETIRED  Tobacco Use   Smoking status: Former   Smokeless tobacco: Never   Tobacco comments:    quit at age 42  Vaping Use   Vaping status: Never Used  Substance and Sexual Activity   Alcohol use: Yes    Alcohol/week: 0.0 standard  drinks of alcohol    Comment: wine or liquor daily   Drug use: Never   Sexual activity: Not on file  Other Topics Concern   Not on file  Social History Narrative   Lives with wife and 2 dogs-2025   Social Drivers of Health   Financial Resource Strain: Low Risk  (11/29/2023)   Overall Financial Resource Strain (CARDIA)    Difficulty of Paying Living Expenses: Not hard at all  Food Insecurity: No Food Insecurity (11/29/2023)   Hunger Vital Sign    Worried About Running Out of Food in the Last Year: Never true    Ran Out of Food in the Last Year: Never true  Transportation Needs: No Transportation Needs (11/29/2023)   PRAPARE -  Administrator, Civil Service (Medical): No    Lack of Transportation (Non-Medical): No  Physical Activity: Sufficiently Active (11/29/2023)   Exercise Vital Sign    Days of Exercise per Week: 4 days    Minutes of Exercise per Session: 60 min  Stress: No Stress Concern Present (11/29/2023)   [employer] of Occupational Health - Occupational Stress Questionnaire    Feeling of Stress : Not at all  Social Connections: Moderately Integrated (11/29/2023)   Social Connection and Isolation Panel [NHANES]    Frequency of Communication with Friends and Family: More than three times a week    Frequency of Social Gatherings with Friends and Family: More than three times a week    Attends Religious Services: Never    Database administrator or Organizations: Yes    Attends Engineer, structural: 1 to 4 times per year    Marital Status: Married    Tobacco Counseling Counseling given: Not Answered Tobacco comments: quit at age 38   Clinical Intake:  Pre-visit preparation completed: Yes  Pain : No/denies pain     BMI - recorded: 24.75 Nutritional Status: BMI of 19-24  Normal Nutritional Risks: None Diabetes: No  How often do you need to have someone help you when you read instructions, pamphlets, or other written materials from your doctor or pharmacy?: 1 - Never     Information entered by :: Shai Rasmussen, RMA   Activities of Daily Living    11/26/2023    4:48 PM  In your present state of health, do you have any difficulty performing the following activities:  Hearing? 0  Vision? 0  Difficulty concentrating or making decisions? 0  Walking or climbing stairs? 0  Dressing or bathing? 0  Doing errands, shopping? 0  Preparing Food and eating ? N  Using the Toilet? N  In the past six months, have you accidently leaked urine? N  Do you have problems with loss of bowel control? N  Managing your Medications? N  Managing your Finances? N  Housekeeping or  managing your Housekeeping? N    Patient Care Team: Plotnikov, Georgina Quint, MD as PCP - General (Internal Medicine) [facility] (Ophthalmology)  Indicate any recent Medical Services you may have received from other than [facility] providers in the past year (date may be approximate).     Assessment:   This is a routine wellness examination for Chrissie Noa.  Hearing/Vision screen Hearing Screening - Comments:: Denies hearing difficulties   Vision Screening - Comments:: Wears eyeglasses   Goals Addressed             This Visit's Progress    DIET - EAT MORE FRUITS AND VEGETABLES   On track  Depression Screen    11/29/2023    1:10 PM 11/23/2022    2:07 PM 05/13/2022    9:44 AM 04/23/2021    9:19 AM  PHQ 2/9 Scores  PHQ - 2 Score 3 0 0 0  PHQ- 9 Score 3 0  0    Fall Risk    11/26/2023    4:48 PM 11/23/2022    2:09 PM 05/13/2022    9:43 AM 04/23/2021    9:19 AM  Fall Risk   Falls in the past year? 0 0 1 0  Number falls in past yr:  0 0 0  Injury with Fall?  0 1 0  Risk for fall due to :  No Fall Risks No Fall Risks;History of fall(s) No Fall Risks  Follow up Falls evaluation completed;Falls prevention discussed Falls prevention discussed;Falls evaluation completed Falls evaluation completed     MEDICARE RISK AT HOME: Medicare Risk at Home Any stairs in or around the home?: (Patient-Rptd) Yes If so, are there any without handrails?: (Patient-Rptd) No Home free of loose throw rugs in walkways, pet beds, electrical cords, etc?: (Patient-Rptd) No Adequate lighting in your home to reduce risk of falls?: (Patient-Rptd) Yes Life alert?: (Patient-Rptd) No Use of a cane, walker or w/c?: (Patient-Rptd) No Grab bars in the bathroom?: (Patient-Rptd) Yes Shower chair or bench in shower?: (Patient-Rptd) Yes Elevated toilet seat or a handicapped toilet?: (Patient-Rptd) No  TIMED UP AND GO:  Was the test performed?  No    Cognitive Function:        11/29/2023   12:53  PM 11/23/2022    2:12 PM  6CIT Screen  What Year? 0 points 0 points  What month? 0 points 0 points  What time? 0 points 0 points  Count back from 20 0 points 0 points  Months in reverse 0 points 0 points  Repeat phrase 2 points 0 points  Total Score 2 points 0 points    Immunizations Immunization History  Administered Date(s) Administered   Influenza, High Dose Seasonal PF 11/03/2017, 11/03/2017   PFIZER(Purple Top)SARS-COV-2 Vaccination 11/22/2019, 12/14/2019   Pneumococcal Conjugate-13 11/03/2017   Pneumococcal Polysaccharide-23 03/23/2019   Tdap 03/01/2018   Zoster Recombinant(Shingrix) 03/01/2018, 05/03/2018    TDAP status: Up to date  Flu Vaccine status: Declined, Education has been provided regarding the importance of this vaccine but patient still declined. Advised may receive this vaccine at local pharmacy or Health Dept. Aware to provide a copy of the vaccination record if obtained from local pharmacy or Health Dept. Verbalized acceptance and understanding.  Pneumococcal vaccine status: Up to date  Covid-19 vaccine status: Declined, Education has been provided regarding the importance of this vaccine but patient still declined. Advised may receive this vaccine at local pharmacy or Health Dept.or vaccine clinic. Aware to provide a copy of the vaccination record if obtained from local pharmacy or Health Dept. Verbalized acceptance and understanding.  Qualifies for Shingles Vaccine? Yes   Zostavax completed Yes   Shingrix Completed?: Yes  Screening Tests Health Maintenance  Topic Date Due   COVID-19 Vaccine (3 - Pfizer risk series) 01/11/2020   Medicare Annual Wellness (AWV)  11/28/2024   DTaP/Tdap/Td (2 - Td or Tdap) 03/01/2028   Pneumonia Vaccine 53+ Years old  Completed   Zoster Vaccines- Shingrix  Completed   HPV VACCINES  Aged Out   INFLUENZA VACCINE  Discontinued    Health Maintenance  Health Maintenance Due  Topic Date Due   COVID-19 Vaccine (3 Water engineer  risk series) 01/11/2020    Colorectal cancer screening: No longer required.   Lung Cancer Screening: (Low Dose CT Chest recommended if Age 31-80 years, 20 pack-year currently smoking OR have quit w/in 15years.) does not qualify.   Lung Cancer Screening Referral: N/A  Additional Screening:  Hepatitis C Screening: does not qualify;  Vision Screening: Recommended annual ophthalmology exams for early detection of glaucoma and other disorders of the eye. Is the patient up to date with their annual eye exam?  Yes  Who is the provider or what is the name of the office in which the patient attends annual eye exams? [facility] If pt is not established with a provider, would they like to be referred to a provider to establish care? No .   Dental Screening: Recommended annual dental exams for proper oral hygiene   Community Resource Referral / Chronic Care Management: CRR required this visit?  No   CCM required this visit?  No     Plan:     I have personally reviewed and noted the following in the patient's chart:   Medical and social history Use of alcohol, tobacco or illicit drugs  Current medications and supplements including opioid prescriptions. Patient is not currently taking opioid prescriptions. Functional ability and status Nutritional status Physical activity Advanced directives List of other physicians Hospitalizations, surgeries, and ER visits in previous 12 months Vitals Screenings to include cognitive, depression, and falls Referrals and appointments  In addition, I have reviewed and discussed with patient certain preventive protocols, quality metrics, and best practice recommendations. A written personalized care plan for preventive services as well as general preventive health recommendations were provided to patient.     Charistopher Rumble L Lina Hitch, CMA   11/29/2023   After Visit Summary: (MyChart) Due to this being a telephonic visit, the after visit  summary with patients personalized plan was offered to patient via MyChart   Nurse Notes: Patient is up to date with all health maintenance with no concerns to address today.  Medical screening examination/treatment/procedure(s) were performed by non-physician practitioner and as supervising physician I was immediately available for consultation/collaboration.  I agree with above. Jacinta Shoe, MD

## 2024-01-09 ENCOUNTER — Other Ambulatory Visit: Payer: Self-pay | Admitting: Internal Medicine

## 2024-01-14 ENCOUNTER — Telehealth: Payer: Self-pay | Admitting: Internal Medicine

## 2024-01-14 MED ORDER — ROSUVASTATIN CALCIUM 20 MG PO TABS: 20.0000 mg | ORAL_TABLET | Freq: Every day | ORAL | 2 refills | Status: DC

## 2024-01-14 NOTE — Telephone Encounter (Signed)
*  STAT* If patient is at the pharmacy, call can be transferred to refill team.   1. Which medications need to be refilled? (please list name of each medication and dose if known)   rosuvastatin (CRESTOR) 20 MG tablet    2. Which pharmacy/location (including street and city if local pharmacy) is medication to be sent to?  [id number] - [place], [place] - [address] AT [facility] OF [address]      3. Do they need a 30 day or 90 day supply? 90 day    Pt is out of medication

## 2024-01-14 NOTE — Telephone Encounter (Signed)
 Pt's medication was sent to pt's pharmacy as requested. Confirmation received.

## 2024-01-18 ENCOUNTER — Ambulatory Visit (INDEPENDENT_AMBULATORY_CARE_PROVIDER_SITE_OTHER)

## 2024-01-18 ENCOUNTER — Encounter: Payer: Self-pay | Admitting: Family Medicine

## 2024-01-18 ENCOUNTER — Ambulatory Visit: Admitting: Family Medicine

## 2024-01-18 ENCOUNTER — Other Ambulatory Visit: Payer: Self-pay

## 2024-01-18 VITALS — BP 142/82 | HR 69 | Ht 67.0 in

## 2024-01-18 DIAGNOSIS — M25512 Pain in left shoulder: Secondary | ICD-10-CM

## 2024-01-18 DIAGNOSIS — M25511 Pain in right shoulder: Secondary | ICD-10-CM

## 2024-01-18 DIAGNOSIS — G8929 Other chronic pain: Secondary | ICD-10-CM

## 2024-01-18 NOTE — Patient Instructions (Addendum)
Thank you for coming in today.   I've referred you to Physical Therapy.  Let us know if you don't hear from them in one week.   Please get an Xray today before you leave

## 2024-01-18 NOTE — Progress Notes (Unsigned)
 I, Stevenson Clinch, CMA acting as a scribe for Clementeen Graham, MD.  Lee Pruitt is a 83 y.o. male who presents to [facility] at [facility] today for bilat shoulder pain x 4-5 years, flaring up over the pas several months, 6. Hx of rotator cuff tear, improved with steroid injection. Pt locates pain to bilat shoulder, R>L. Locates pain to lateral aspect of the shoulder radiating into the upper arm. ROM well maintained but not without pain. Taking Naproxen daily, sometimes BID.   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  Radiates: into the upper arm Aggravates: weighted lifting Treatments tried: Aleve  Pertinent review of systems: No fevers or chills  Relevant historical information: History of prostate cancer.   Exam:  BP (!) 142/82   Pulse 69   Ht 5\' 7"  (1.702 m)   SpO2 97%   BMI 24.75 kg/m  General: Well Developed, well nourished, and in no acute distress.   MSK: Right shoulder normal-appearing Nontender to palpation. Range of motion abduction 120 degrees.  Functional internal rotation lumbar spine. External rotation full. Strength is intact. Positive Hawkins and Neer's test.  Negative Yergason's and speeds test.  Left shoulder: Normal-appearing Nontender to palpation. Range of motion abduction 120 degrees.  Functional internal rotation lumbar spine. External rotation is full. Strength is intact. Positive Hawkins and Neer's test.  Negative Yergason's and speeds test.   Lab and Radiology Results  Procedure: Real-time Ultrasound Guided Injection of right shoulder subacromial bursa Device: Philips Affiniti 50G/GE Logiq Images permanently stored and available for review in PACS Verbal informed consent obtained.  Discussed risks and benefits of procedure. Warned about infection, bleeding, hyperglycemia damage to structures among others. Patient expresses understanding and  agreement Time-out conducted.   Noted no overlying erythema, induration, or other signs of local infection.   Skin prepped in a sterile fashion.   Local anesthesia: Topical Ethyl chloride.   With sterile technique and under real time ultrasound guidance: 40 mg of Kenalog and 2 mL of Marcaine injected into the acromial bursa. Fluid seen entering the bursa.   Completed without difficulty   Pain immediately resolved suggesting accurate placement of the medication.   Advised to call if fevers/chills, erythema, induration, drainage, or persistent bleeding.   Images permanently stored and available for review in the ultrasound unit.  Impression: Technically successful ultrasound guided injection.    Procedure: Real-time Ultrasound Guided Injection of left shoulder subacromial bursa Device: Philips Affiniti 50G/GE Logiq Images permanently stored and available for review in PACS Verbal informed consent obtained.  Discussed risks and benefits of procedure. Warned about infection, bleeding, hyperglycemia damage to structures among others. Patient expresses understanding and agreement Time-out conducted.   Noted no overlying erythema, induration, or other signs of local infection.   Skin prepped in a sterile fashion.   Local anesthesia: Topical Ethyl chloride.   With sterile technique and under real time ultrasound guidance: 40 mg of Kenalog and 2 mL of Marcaine injected into subacromial bursa. Fluid seen entering the bursa.   Completed without difficulty   Pain immediately resolved suggesting accurate placement of the medication.   Advised to call if fevers/chills, erythema, induration, drainage, or persistent bleeding.   Images permanently stored and available for review in the ultrasound unit.  Impression: Technically successful ultrasound guided injection.    X-ray images bilateral shoulders obtained today personally and independently interpreted.  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  No acute fractures are present.  Left shoulder: AC DJD.  Minimal glenohumeral DJD.  No acute fractures are visible.   Await formal radiology review     Assessment and Plan: 83 y.o. male with bilateral shoulder pain.  This is a chronic problem with an acute recurrence.  He has had previous injections and previous MRIs at other locations in the past.  Plan for bilateral subacromial injection today and referral to physical therapy.  He lives near [place] most of the time so we will pick a physical therapy location up there.   PDMP not reviewed this encounter. Orders Placed This Encounter  Procedures   [place] LIMITED JOINT SPACE STRUCTURES UP BILAT(NO LINKED CHARGES)    Reason for Exam (SYMPTOM  OR DIAGNOSIS REQUIRED):   bilat shoulder pain    Preferred imaging location?:   [facility] Shoulder Right    Standing Status:   Future    Number of Occurrences:   1    Expiration Date:   01/17/2025    Reason for Exam (SYMPTOM  OR DIAGNOSIS REQUIRED):   bilat shoulder pain    Preferred imaging location?:   [facility]   DG Shoulder Left    Standing Status:   Future    Number of Occurrences:   1    Expiration Date:   02/18/2024    Reason for Exam (SYMPTOM  OR DIAGNOSIS REQUIRED):   bilat shoulder pain    Preferred imaging location?:   [facility]   Ambulatory referral to Physical Therapy    Referral Priority:   Routine    Referral Type:   Physical Medicine    Referral Reason:   Specialty Services Required    Requested Specialty:   Physical Therapy    Number of Visits Requested:   1   No orders of the defined types were placed in this encounter.    Discussed warning signs or symptoms. Please see discharge instructions. Patient expresses understanding.   The above documentation has been reviewed and is accurate and complete Clementeen Graham, M.D.

## 2024-01-24 ENCOUNTER — Encounter: Payer: Self-pay | Admitting: Family Medicine

## 2024-01-24 NOTE — Progress Notes (Signed)
Right shoulder x-ray shows mild arthritis

## 2024-01-24 NOTE — Progress Notes (Signed)
Left shoulder x-ray shows mild arthritis changes.

## 2024-02-23 ENCOUNTER — Other Ambulatory Visit: Payer: Self-pay | Admitting: Internal Medicine

## 2024-02-27 MED ORDER — TADALAFIL 10 MG PO TABS: 10.0000 mg | ORAL_TABLET | Freq: Every day | ORAL | 1 refills | Status: AC | PRN

## 2024-03-02 ENCOUNTER — Other Ambulatory Visit: Payer: Self-pay | Admitting: Urology

## 2024-03-02 DIAGNOSIS — C61 Malignant neoplasm of prostate: Secondary | ICD-10-CM

## 2024-04-19 NOTE — Progress Notes (Unsigned)
 Joanna Muck, PhD, LAT, ATC acting as a scribe for Garlan Juniper, MD.  Lee Pruitt is a 83 y.o. male who presents to [facility] at [facility] today for L shoulder pain. Pt was last seen by Dr. Alease Hunter on 01/18/24 and was given bilat subacromial steroid injections and was referred to [facility], but failed to schedule any visits.  Today, pt reports PT was not convenient for him. L shoulder pain has returned over the last month. He notes no pain in the R shoulder but limited in AROM, esp overhead. [place] land by himself. Pain is disturbing his sleep at night.   Dx imaging: 01/18/24 R & L shoulder XR  Pertinent review of systems: No fevers or chills  Relevant historical information: Prostate cancer   Exam:  BP 124/78   Pulse (!) 50   Ht 5' 7 (1.702 m)   Wt 160 lb (72.6 kg)   SpO2 98%   BMI 25.06 kg/m  General: Well Developed, well nourished, and in no acute distress.   MSK: Left shoulder: Normal-appearing Reduced range of motion abduction. Strength is reduced abduction. Intact strength.    Lab and Radiology Results  Procedure: Real-time Ultrasound Guided Injection of left shoulder subacromial bursa Device: Philips Affiniti 50G/GE Logiq Images permanently stored and available for review in PACS Verbal informed consent obtained.  Discussed risks and benefits of procedure. Warned about infection, bleeding, hyperglycemia damage to structures among others. Patient expresses understanding and agreement Time-out conducted.   Noted no overlying erythema, induration, or other signs of local infection.   Skin prepped in a sterile fashion.   Local anesthesia: Topical Ethyl chloride.   With sterile technique and under real time ultrasound guidance: 40 mg of Kenalog and 2 mL of Marcaine injected into subacromial bursa. Fluid seen entering the bursa.   Completed without difficulty   Pain immediately  resolved suggesting accurate placement of the medication.   Advised to call if fevers/chills, erythema, induration, drainage, or persistent bleeding.   Images permanently stored and available for review in the ultrasound unit.  Impression: Technically successful ultrasound guided injection.   EXAM: LEFT SHOULDER - 2+ VIEW   COMPARISON:  None Available.   FINDINGS: There is no evidence of fracture or dislocation. Acromioclavicular degenerative spurring. There is trace spurring of the superior glenoid. No erosion or evidence of focal bone abnormality. Soft tissues are unremarkable.   IMPRESSION: Mild acromioclavicular and glenohumeral degenerative change.     Electronically Signed   By: Chadwick Colonel M.D.   On: 01/24/2024 10:41 I, Garlan Juniper, personally (independently) visualized and performed the interpretation of the images attached in this note.     Assessment and Plan: 83 y.o. male with left shoulder pain.  This is a chronic problem with an acute recurrence.  Pain due to subacromial impingement and bursitis.  Plan for repeat steroid injection.  Previous injection was about 3 months ago.  Additionally will refer to a different physical therapy location in [place] .  Consider right-sided injection in the future if needed.  Check back as needed.   PDMP not reviewed this encounter. Orders Placed This Encounter  Procedures   US  LIMITED JOINT SPACE STRUCTURES UP BILAT(NO LINKED CHARGES)    Reason for Exam (SYMPTOM  OR DIAGNOSIS REQUIRED):   bilateral shoulder pain    Preferred imaging location?:   [facility] referral  to Physical Therapy    Referral Priority:   Routine    Referral Type:   Physical Medicine    Referral Reason:   Specialty Services Required    Requested Specialty:   Physical Therapy    Number of Visits Requested:   1   No orders of the defined types were placed in this encounter.    Discussed warning  signs or symptoms. Please see discharge instructions. Patient expresses understanding.   The above documentation has been reviewed and is accurate and complete Garlan Juniper, M.D.

## 2024-04-20 ENCOUNTER — Other Ambulatory Visit: Payer: Self-pay

## 2024-04-20 ENCOUNTER — Ambulatory Visit: Admitting: Family Medicine

## 2024-04-20 VITALS — BP 124/78 | HR 50 | Ht 67.0 in | Wt 160.0 lb

## 2024-04-20 DIAGNOSIS — M25512 Pain in left shoulder: Secondary | ICD-10-CM | POA: Diagnosis not present

## 2024-04-20 DIAGNOSIS — G8929 Other chronic pain: Secondary | ICD-10-CM

## 2024-04-20 NOTE — Patient Instructions (Addendum)
 Thank you for coming in today.   You received an injection today. Seek immediate medical attention if the joint becomes red, extremely painful, or is oozing fluid.   I've referred you to Physical Therapy.  Let us  know if you don't hear from them in one week.   Let me know if you want the right shoulder injected in the future

## 2024-04-25 ENCOUNTER — Ambulatory Visit
Admission: RE | Admit: 2024-04-25 | Discharge: 2024-04-25 | Disposition: A | Discharge status: Home / Self Care | Source: Ambulatory Visit | Attending: Urology | Admitting: Urology

## 2024-04-25 DIAGNOSIS — C61 Malignant neoplasm of prostate: Secondary | ICD-10-CM

## 2024-04-25 MED ORDER — GADOPICLENOL 0.5 MMOL/ML IV SOLN
7.5000 mL | Freq: Once | INTRAVENOUS | Status: AC | PRN
  Administered 2024-04-25: 7.5 mL via INTRAVENOUS

## 2024-07-18 ENCOUNTER — Other Ambulatory Visit: Payer: Self-pay | Admitting: Internal Medicine

## 2024-07-19 ENCOUNTER — Other Ambulatory Visit: Payer: Self-pay | Admitting: Internal Medicine

## 2024-07-19 MED ORDER — EZETIMIBE 10 MG PO TABS: 10.0000 mg | ORAL_TABLET | Freq: Every day | ORAL | 1 refills | Status: AC

## 2024-09-06 ENCOUNTER — Telehealth: Payer: Self-pay | Admitting: Internal Medicine

## 2024-09-06 NOTE — Telephone Encounter (Signed)
 Patient was calling to speak to the dr about his medication but we got disconnected. Please advise

## 2024-09-06 NOTE — Telephone Encounter (Signed)
 Spoke with the patient who states that he has been dealing with shoulder pain for a bit now. 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 He states that he has now started taking dual action Advil daily. He states that it works somewhat but it does not help him at night. He has a lot of trouble sleeping. He has been taking Unisom but it has not been helping. He would like to know if he can take Advil PM. Advised patient that he should reach out to his PCP in regards to sleep and pain medications. Patient verbalized understanding.

## 2024-10-10 ENCOUNTER — Other Ambulatory Visit: Payer: Self-pay

## 2024-10-12 MED ORDER — ROSUVASTATIN CALCIUM 20 MG PO TABS: 20.0000 mg | ORAL_TABLET | Freq: Every day | ORAL | 0 refills | Status: AC
